# Patient Record
Sex: Female | Born: 1975 | Race: Black or African American | Hispanic: No | Marital: Single | Smoking: Current every day smoker
Health system: Southern US, Community
[De-identification: ages and names within clinical notes are randomized; demographics above are authoritative.]

## PROBLEM LIST (undated history)

## (undated) DIAGNOSIS — G039 Meningitis, unspecified: Secondary | ICD-10-CM

## (undated) DIAGNOSIS — J45909 Unspecified asthma, uncomplicated: Secondary | ICD-10-CM

## (undated) DIAGNOSIS — E739 Lactose intolerance, unspecified: Secondary | ICD-10-CM

## (undated) HISTORY — PX: TUBAL LIGATION: SHX77

---

## 2003-12-25 ENCOUNTER — Emergency Department: Payer: Self-pay | Admitting: Unknown Physician Specialty

## 2004-03-15 ENCOUNTER — Emergency Department: Payer: Self-pay | Admitting: Emergency Medicine

## 2004-07-13 ENCOUNTER — Emergency Department: Payer: Self-pay | Admitting: Emergency Medicine

## 2004-08-23 ENCOUNTER — Emergency Department: Payer: Self-pay | Admitting: Emergency Medicine

## 2005-02-06 ENCOUNTER — Emergency Department: Payer: Self-pay | Admitting: Emergency Medicine

## 2006-05-19 ENCOUNTER — Emergency Department: Payer: Self-pay | Admitting: Emergency Medicine

## 2006-09-27 ENCOUNTER — Emergency Department: Payer: Self-pay | Admitting: Internal Medicine

## 2007-06-30 ENCOUNTER — Emergency Department: Payer: Self-pay | Admitting: Emergency Medicine

## 2007-09-19 ENCOUNTER — Emergency Department: Payer: Self-pay

## 2007-11-09 ENCOUNTER — Emergency Department: Payer: Self-pay | Admitting: Emergency Medicine

## 2008-02-13 ENCOUNTER — Emergency Department: Payer: Self-pay | Admitting: Emergency Medicine

## 2008-02-19 ENCOUNTER — Emergency Department: Payer: Self-pay | Admitting: Emergency Medicine

## 2008-11-28 ENCOUNTER — Emergency Department: Payer: Self-pay | Admitting: Emergency Medicine

## 2010-11-21 ENCOUNTER — Emergency Department: Payer: Self-pay | Admitting: Emergency Medicine

## 2011-08-06 ENCOUNTER — Emergency Department: Payer: Self-pay | Admitting: Emergency Medicine

## 2011-08-06 LAB — CBC
HCT: 40 % (ref 35.0–47.0)
HGB: 12.7 g/dL (ref 12.0–16.0)
MCH: 27 pg (ref 26.0–34.0)
WBC: 12.1 10*3/uL — ABNORMAL HIGH (ref 3.6–11.0)

## 2011-08-06 LAB — URINALYSIS, COMPLETE
Bacteria: NONE SEEN
Specific Gravity: 1.032 (ref 1.003–1.030)
Squamous Epithelial: 23

## 2011-08-06 LAB — PREGNANCY, URINE: Pregnancy Test, Urine: NEGATIVE m[IU]/mL

## 2012-07-13 ENCOUNTER — Emergency Department: Payer: Self-pay | Admitting: Emergency Medicine

## 2012-09-07 ENCOUNTER — Emergency Department: Payer: Self-pay | Admitting: Emergency Medicine

## 2013-01-16 ENCOUNTER — Encounter (HOSPITAL_COMMUNITY): Payer: Self-pay | Admitting: Emergency Medicine

## 2013-01-16 ENCOUNTER — Emergency Department (HOSPITAL_COMMUNITY)
Admission: EM | Admit: 2013-01-16 | Discharge: 2013-01-16 | Disposition: A | Discharge status: Home / Self Care | Payer: Self-pay | Attending: Emergency Medicine | Admitting: Emergency Medicine

## 2013-01-16 DIAGNOSIS — R52 Pain, unspecified: Secondary | ICD-10-CM | POA: Insufficient documentation

## 2013-01-16 DIAGNOSIS — F17200 Nicotine dependence, unspecified, uncomplicated: Secondary | ICD-10-CM | POA: Insufficient documentation

## 2013-01-16 DIAGNOSIS — Z791 Long term (current) use of non-steroidal anti-inflammatories (NSAID): Secondary | ICD-10-CM | POA: Insufficient documentation

## 2013-01-16 DIAGNOSIS — R220 Localized swelling, mass and lump, head: Secondary | ICD-10-CM | POA: Insufficient documentation

## 2013-01-16 DIAGNOSIS — K089 Disorder of teeth and supporting structures, unspecified: Secondary | ICD-10-CM | POA: Insufficient documentation

## 2013-01-16 DIAGNOSIS — H9202 Otalgia, left ear: Secondary | ICD-10-CM

## 2013-01-16 DIAGNOSIS — H9209 Otalgia, unspecified ear: Secondary | ICD-10-CM | POA: Insufficient documentation

## 2013-01-16 DIAGNOSIS — J45909 Unspecified asthma, uncomplicated: Secondary | ICD-10-CM | POA: Insufficient documentation

## 2013-01-16 HISTORY — DX: Unspecified asthma, uncomplicated: J45.909

## 2013-01-16 HISTORY — DX: Meningitis, unspecified: G03.9

## 2013-01-16 MED ORDER — HYDROCODONE-ACETAMINOPHEN 5-325 MG PO TABS: 1.0000 | ORAL_TABLET | ORAL | Status: DC | PRN

## 2013-01-16 MED ORDER — IBUPROFEN 400 MG PO TABS
800.0000 mg | ORAL_TABLET | Freq: Once | ORAL | Status: AC
  Administered 2013-01-16: 800 mg via ORAL
  Filled 2013-01-16: qty 2

## 2013-01-16 MED ORDER — CIPROFLOXACIN HCL 500 MG PO TABS: 500.0000 mg | ORAL_TABLET | Freq: Two times a day (BID) | ORAL | Status: DC

## 2013-01-16 MED ORDER — IBUPROFEN 800 MG PO TABS: 800.0000 mg | ORAL_TABLET | Freq: Three times a day (TID) | ORAL | Status: DC

## 2013-01-16 NOTE — ED Provider Notes (Signed)
CSN: [id number]     Arrival date & time 01/16/13  1458 History  This chart was scribed for Felicie Morn, NP, working with Celene Kras, MD, by Ardelia Mems ED Scribe. This patient was seen in room TR08C/TR08C and the patient's care was started at 3:50 PM.   Chief Complaint  Patient presents with  . Otalgia  . Facial Swelling    Patient is a 37 y.o. female presenting with ear pain. The history is provided by the patient. No language interpreter was used.  Otalgia Location:  Left Quality:  Throbbing Severity:  Moderate Onset quality:  Sudden Duration:  3 days Timing:  Intermittent Progression:  Unchanged Chronicity:  New Context comment:  Reports feeling her ear "pop", causing the onset of this pain Relieved by:  None tried Worsened by:  Nothing tried Ineffective treatments:  None tried Associated symptoms: no fever   Associated symptoms comment:  Facial pain. Facial swelling. Dental pain.   HPI Comments: Emma [name] is a 37 y.o. female who presents to the Emergency Department complaining of intermittent, moderate "throbbing" left ear pain onset 3 days ago. She states that she felt her left ear "pop" 3 days ago, causing the onset of her pain. She also reports associated left-sided facial swelling, "throbbing" left-sided facial pain and "throbbing" upper left dental pain onset 3 days ago, after her ear "popped". She states that she has not been able to eat over the past 3 days due to her pain. She states that she has no prior history of dental pain. She states that she has no PCP and no insurance. She denies trouble swallowing or any other pain or symptoms. She states that her only medication allergy is Zithromax.   Past Medical History  Diagnosis Date  . Asthma   . Meningitis    Past Surgical History  Procedure Laterality Date  . Tubal ligation     No family history on file. History  Substance Use Topics  . Smoking status: Current Every Day Smoker  . Smokeless tobacco:  Not on file  . Alcohol Use: Yes     Comment: occ   OB History   Grav Para Term Preterm Abortions TAB SAB Ect Mult Living                 Review of Systems  Constitutional: Negative for fever.  HENT: Positive for ear pain (left) and facial swelling (left-sided). Negative for trouble swallowing.        Left-sided facial pain  All other systems reviewed and are negative.   Allergies  Zithromax  Home Medications   Current Outpatient Rx  Name  Route  Sig  Dispense  Refill  . ciprofloxacin (CIPRO) 500 MG tablet   Oral   Take 1 tablet (500 mg total) by mouth every 12 (twelve) hours.   10 tablet   0   . HYDROcodone-acetaminophen (NORCO/VICODIN) 5-325 MG per tablet   Oral   Take 1 tablet by mouth every 4 (four) hours as needed for severe pain.   10 tablet   0   . ibuprofen (ADVIL,MOTRIN) 800 MG tablet   Oral   Take 1 tablet (800 mg total) by mouth 3 (three) times daily.   21 tablet   0     Triage Vitals: BP 138/82  Pulse 105  Temp(Src) 98.9 F (37.2 C) (Oral)  Resp 22  SpO2 95%  LMP 01/04/2013  Physical Exam  Nursing note and vitals reviewed. Constitutional: She is oriented to person,  place, and time. She appears well-developed and well-nourished. No distress.  HENT:  Head: Normocephalic and atraumatic.  Edematous outer ear canal on the left. TM appears to be intact.   Eyes: EOM are normal.  Neck: Neck supple. No tracheal deviation present.  Cardiovascular: Normal rate.   Pulmonary/Chest: Effort normal. No respiratory distress.  Musculoskeletal: Normal range of motion.  Neurological: She is alert and oriented to person, place, and time.  Skin: Skin is warm and dry.  Psychiatric: She has a normal mood and affect. Her behavior is normal.    ED Course  Procedures (including critical care time)  DIAGNOSTIC STUDIES: Oxygen Saturation is 95% on RA, adequate by my interpretation.    COORDINATION OF CARE: 3:54 PM- Will order Motrin. Will discharge with Cipro,  Motrin and Norco. Pt advised of plan for treatment and pt agrees.  Labs Review Labs Reviewed - No data to display Imaging Review No results found.  EKG Interpretation   None       MDM   1. Acute otalgia, left       I personally performed the services described in this documentation, which was scribed in my presence. The recorded information has been reviewed and is accurate.    Jimmye Norman, NP 01/16/13 [phone]

## 2013-01-16 NOTE — ED Notes (Signed)
Pt st's she felt a pop in left ear 2 days ago.  Now has pain to left ear and left side of face with swelling.  Pt also c/o pain to left upper gum.

## 2013-01-16 NOTE — ED Provider Notes (Signed)
Medical screening examination/treatment/procedure(s) were performed by non-physician practitioner and as supervising physician I was immediately available for consultation/collaboration.    Celene Kras, MD 01/16/13 [phone]

## 2013-01-16 NOTE — ED Notes (Signed)
Pt states she felt something pop inside left ear and reports tooth pain.  Pt has facial swelling to left side of face.

## 2013-01-18 ENCOUNTER — Inpatient Hospital Stay: Payer: Self-pay | Admitting: Internal Medicine

## 2013-01-18 LAB — COMPREHENSIVE METABOLIC PANEL
Alkaline Phosphatase: 84 U/L
Anion Gap: 3 — ABNORMAL LOW (ref 7–16)
BUN: 6 mg/dL — ABNORMAL LOW (ref 7–18)
Bilirubin,Total: 0.9 mg/dL (ref 0.2–1.0)
Calcium, Total: 9.2 mg/dL (ref 8.5–10.1)
Chloride: 104 mmol/L (ref 98–107)
Creatinine: 0.77 mg/dL (ref 0.60–1.30)
EGFR (African American): >60
Osmolality: 269 (ref 275–301)
Potassium: 3.5 mmol/L (ref 3.5–5.1)
SGOT(AST): 39 U/L — ABNORMAL HIGH (ref 15–37)
Sodium: 136 mmol/L (ref 136–145)

## 2013-01-18 LAB — CBC WITH DIFFERENTIAL/PLATELET
Basophil #: 0 10*3/uL (ref 0.0–0.1)
Basophil %: 0.6 %
HCT: 39.9 % (ref 35.0–47.0)
MCHC: 33.2 g/dL (ref 32.0–36.0)
Neutrophil #: 5.9 10*3/uL (ref 1.4–6.5)
Neutrophil %: 69.5 %
Platelet: 317 10*3/uL (ref 150–440)
RBC: 4.6 10*6/uL (ref 3.80–5.20)
WBC: 8.5 10*3/uL (ref 3.6–11.0)

## 2013-01-19 LAB — DRUG SCREEN, URINE
Cannabinoid 50 Ng, Ur ~~LOC~~: NEGATIVE (ref ?–50)
Cocaine Metabolite,Ur ~~LOC~~: POSITIVE (ref ?–300)
MDMA (Ecstasy)Ur Screen: NEGATIVE (ref ?–500)
Methadone, Ur Screen: NEGATIVE (ref ?–300)
Opiate, Ur Screen: POSITIVE (ref ?–300)

## 2013-01-19 LAB — CBC WITH DIFFERENTIAL/PLATELET
Basophil %: 0.4 %
HGB: 11.7 g/dL — ABNORMAL LOW (ref 12.0–16.0)
Lymphocyte #: 2.1 10*3/uL (ref 1.0–3.6)
MCH: 28.8 pg (ref 26.0–34.0)
MCHC: 33.3 g/dL (ref 32.0–36.0)
MCV: 86 fL (ref 80–100)
Monocyte #: 0.7 x10 3/mm (ref 0.2–0.9)
Monocyte %: 9.9 %
WBC: 7.2 10*3/uL (ref 3.6–11.0)

## 2013-01-19 LAB — BASIC METABOLIC PANEL
Creatinine: 0.73 mg/dL (ref 0.60–1.30)
EGFR (African American): >60
Osmolality: 269 (ref 275–301)
Potassium: 3.2 mmol/L — ABNORMAL LOW (ref 3.5–5.1)

## 2013-01-20 LAB — POTASSIUM: Potassium: 3.6 mmol/L (ref 3.5–5.1)

## 2013-01-23 LAB — CULTURE, BLOOD (SINGLE)

## 2013-01-26 LAB — WOUND CULTURE

## 2013-10-11 ENCOUNTER — Encounter (HOSPITAL_COMMUNITY): Payer: Self-pay | Admitting: Emergency Medicine

## 2013-10-11 ENCOUNTER — Emergency Department (HOSPITAL_COMMUNITY)
Admission: EM | Admit: 2013-10-11 | Discharge: 2013-10-11 | Disposition: A | Discharge status: Home / Self Care | Payer: Self-pay | Attending: Emergency Medicine | Admitting: Emergency Medicine

## 2013-10-11 DIAGNOSIS — J45909 Unspecified asthma, uncomplicated: Secondary | ICD-10-CM | POA: Insufficient documentation

## 2013-10-11 DIAGNOSIS — Z880 Allergy status to penicillin: Secondary | ICD-10-CM | POA: Insufficient documentation

## 2013-10-11 DIAGNOSIS — Z8639 Personal history of other endocrine, nutritional and metabolic disease: Secondary | ICD-10-CM | POA: Insufficient documentation

## 2013-10-11 DIAGNOSIS — Z791 Long term (current) use of non-steroidal anti-inflammatories (NSAID): Secondary | ICD-10-CM | POA: Insufficient documentation

## 2013-10-11 DIAGNOSIS — F17200 Nicotine dependence, unspecified, uncomplicated: Secondary | ICD-10-CM | POA: Insufficient documentation

## 2013-10-11 DIAGNOSIS — Z79899 Other long term (current) drug therapy: Secondary | ICD-10-CM | POA: Insufficient documentation

## 2013-10-11 DIAGNOSIS — Z8669 Personal history of other diseases of the nervous system and sense organs: Secondary | ICD-10-CM | POA: Insufficient documentation

## 2013-10-11 DIAGNOSIS — R509 Fever, unspecified: Secondary | ICD-10-CM | POA: Insufficient documentation

## 2013-10-11 DIAGNOSIS — R220 Localized swelling, mass and lump, head: Secondary | ICD-10-CM | POA: Insufficient documentation

## 2013-10-11 DIAGNOSIS — K0889 Other specified disorders of teeth and supporting structures: Secondary | ICD-10-CM

## 2013-10-11 DIAGNOSIS — R221 Localized swelling, mass and lump, neck: Secondary | ICD-10-CM

## 2013-10-11 DIAGNOSIS — Z862 Personal history of diseases of the blood and blood-forming organs and certain disorders involving the immune mechanism: Secondary | ICD-10-CM | POA: Insufficient documentation

## 2013-10-11 DIAGNOSIS — K089 Disorder of teeth and supporting structures, unspecified: Secondary | ICD-10-CM | POA: Insufficient documentation

## 2013-10-11 HISTORY — DX: Lactose intolerance, unspecified: E73.9

## 2013-10-11 MED ORDER — CLINDAMYCIN HCL 150 MG PO CAPS: 150.0000 mg | ORAL_CAPSULE | Freq: Four times a day (QID) | ORAL | Status: DC

## 2013-10-11 MED ORDER — IBUPROFEN 400 MG PO TABS
600.0000 mg | ORAL_TABLET | Freq: Once | ORAL | Status: AC
  Administered 2013-10-11: 600 mg via ORAL
  Filled 2013-10-11: qty 2

## 2013-10-11 MED ORDER — IBUPROFEN 600 MG PO TABS: 600.0000 mg | ORAL_TABLET | Freq: Four times a day (QID) | ORAL | Status: DC | PRN

## 2013-10-11 MED ORDER — OXYCODONE-ACETAMINOPHEN 5-325 MG PO TABS: 1.0000 | ORAL_TABLET | ORAL | Status: DC | PRN

## 2013-10-11 NOTE — ED Notes (Addendum)
Pt reports left sided facial swelling,left ear pain x2 days. Pt reports left ear ringing and "stabbing pain" in ear. Pt denies any known injury. nad noted. Airway patent.

## 2013-10-11 NOTE — ED Notes (Signed)
MD at bedside. 

## 2013-10-11 NOTE — Discharge Instructions (Signed)
Dental Pain °A tooth ache may be caused by cavities (tooth decay). Cavities expose the nerve of the tooth to air and hot or cold temperatures. It may come from an infection or abscess (also called a boil or furuncle) around your tooth. It is also often caused by dental caries (tooth decay). This causes the pain you are having. °DIAGNOSIS  °Your caregiver can diagnose this problem by exam. °TREATMENT  °· If caused by an infection, it may be treated with medications which kill germs (antibiotics) and pain medications as prescribed by your caregiver. Take medications as directed. °· Only take over-the-counter or prescription medicines for pain, discomfort, or fever as directed by your caregiver. °· Whether the tooth ache today is caused by infection or dental disease, you should see your dentist as soon as possible for further care. °SEEK MEDICAL CARE IF: °The exam and treatment you received today has been provided on an emergency basis only. This is not a substitute for complete medical or dental care. If your problem worsens or new problems (symptoms) appear, and you are unable to meet with your dentist, call or return to this location. °SEEK IMMEDIATE MEDICAL CARE IF:  °· You have a fever. °· You develop redness and swelling of your face, jaw, or neck. °· You are unable to open your mouth. °· You have severe pain uncontrolled by pain medicine. °MAKE SURE YOU:  °· Understand these instructions. °· Will watch your condition. °· Will get help right away if you are not doing well or get worse. °Document Released: 01/11/2005 Document Revised: 04/05/2011 Document Reviewed: 08/30/2007 °ExitCare® Patient Information ©2015 [employer]. This information is not intended to replace advice given to you by your health care provider. Make sure you discuss any questions you have with your health care provider. ° °Emergency Department Resource Guide °1) Find a Doctor and Pay Out of Pocket °Although you won't have to find out who  is covered by your insurance plan, it is a good idea to ask around and get recommendations. You will then need to call the office and see if the doctor you have chosen will accept you as a new patient and what types of options they offer for patients who are self-pay. Some doctors offer discounts or will set up payment plans for their patients who do not have insurance, but you will need to ask so you aren't surprised when you get to your appointment. ° °2) Contact Your Local Health Department °Not all health departments have doctors that can see patients for sick visits, but many do, so it is worth a call to see if yours does. If you don't know where your local health department is, you can check in your phone book. The CDC also has a tool to help you locate your state's health department, and many state websites also have listings of all of their local health departments. ° °3) Find a Walk-in Clinic °If your illness is not likely to be very severe or complicated, you may want to try a walk in clinic. These are popping up all over the country in pharmacies, drugstores, and shopping centers. They're usually staffed by nurse practitioners or physician assistants that have been trained to treat common illnesses and complaints. They're usually fairly quick and inexpensive. However, if you have serious medical issues or chronic medical problems, these are probably not your best option. ° °No Primary Care Doctor: °- Call [facility] at  832-8000 - they can help you locate a primary   care doctor that  accepts your insurance, provides certain services, etc. °- Physician Referral Service- 1-800-533-3463 ° °Chronic Pain Problems: °Organization         Address  Phone   Notes  °[facility]  (336) 297-2271 Patients need to be referred by their primary care doctor.  ° °Medication Assistance: °Organization         Address  Phone   Notes  °[place] [facility] [address]  [address] °[place], [place] [address] (336) 641-8030 --Must be a resident of [place] °-- Must have NO insurance coverage whatsoever (no Medicaid/ Medicare, etc.) °-- The pt. MUST have a primary care doctor that directs their care regularly and follows them in the community °  °[facility]  (866) 331-1348   °[employer]  (888) 892-1162   ° °Agencies that provide inexpensive medical care: °Organization         Address  Phone   Notes  °[facility]  (336) 832-8035   °[facility]    (336) 832-7272   °[facility] [address] °[place], [place] [address] (336) 832-4777   °[facility] [address], °[place] (336) 271-4999   °[facility]    (336) 373-0678   °[facility]    (336) 272-1050   °[facility] ° [address], [place] Phone:  (336) 832-4444, Fax:  (336) 832-4440 Hours of Operation:  9 am - 6 pm, M-F.  Also accepts Medicaid/Medicare and self-pay.  °[facility] ° [address], [place] Phone: (336) 832-3150, Fax: (336) 832-3151. Hours of Operation:  8:30 am - 5:30 pm, M-F.  Also accepts Medicaid and self-pay.  °[facility] [place] [address], [place] Phone: (336) 878-6027   °[facility] [address], [place], [place] (336)723-1848, Ext. 123 Mondays & Thursdays: 7-9 AM.  First 15 patients are seen on a first come, first serve basis. °  ° °Medicaid-accepting [place] Providers: ° °Organization         Address  Phone   Notes  °[facility] [address], [place] (336) 641-2100 Also accepts self-pay patients.  °[facility] [address], [place] ° (336) 856-9996   °[facility] [address], [place] (336) 288-8857   °[facility] [address] [place] [address], [place] (336) 299-7000   °Veita Bland [address], [place]  ° (336) 373-1557 Only accepts [place] Access Medicaid patients after they have their name applied to their card.  ° °Self-Pay (no insurance) in [place]: ° °Organization         Address  Phone   Notes  °Sickle Cell Patients, [facility] [address], [place] (336) 832-1970   °[facility] [address], [place] (336) 832-4400   °[facility] ° [address], [place] (336) 992-4800   °[facility]/Dr. Osei-Bonsu ° [address] [place] [address], [place] or [address], [place] (336) 841-8500 Phone number for both [place] and [place] locations is the same.  °[facility] [address],  (336) 299-0000   °[facility]  [address] [place] [address],  or [address] (336) 852-7530 °(336) 878-2260   °  [facility] [address], [place] (336) 350-1642, phone; (336) 294-5005, fax Sees patients 1st and 3rd Saturday of every month.  Must not qualify for public or private insurance (i.e. Medicaid, Medicare, [place] Health Choice, Veterans' Benefits) • Household income should be no more than 200% of the poverty level •The clinic cannot treat you if you are pregnant or think you are pregnant • Sexually transmitted diseases are not treated at the clinic.  ° ° °Dental Care: °Organization         Address  Phone  Notes  °[facility] [address], [place] (336) 641-6152 Accepts children up to age 21 who are enrolled in Medicaid or [place] Health Choice; pregnant women with a Medicaid card; and children who have applied for Medicaid or [place] Health Choice, but were declined, whose parents can pay a reduced fee at time of service.  °[facility] [place]  [address], [place] (336) 641-7733 Accepts children up to age 21 who are  enrolled in Medicaid or [place] Health Choice; pregnant women with a Medicaid card; and children who have applied for Medicaid or [place] Health Choice, but were declined, whose parents can pay a reduced fee at time of service.  °[facility] ° [address], [place] (336) 641-4533 Patients are seen by appointment only. Walk-ins are not accepted. [facility] will see patients 18 years of age and older. °Monday - Tuesday (8am-5pm) °Most Wednesdays (8:30-5pm) °$30 per visit, cash only  °[facility] ° [address], [place] (336) 641-4533 Patients are seen by appointment only. Walk-ins are not accepted. [facility] will see patients 18 years of age and older. °One Wednesday Evening (Monthly: Volunteer Based).  $30 per visit, cash only  °[facility]  (919) 537-3737 for adults; Children under age 4, call Graduate Pediatric Dentistry at (919) [phone]. Children aged 4-14, please call (919) [phone] to request a pediatric application. ° Dental services are provided in all areas of dental care including fillings, crowns and bridges, complete and partial dentures, implants, gum treatment, root canals, and extractions. Preventive care is also provided. Treatment is provided to both adults and children. °Patients are selected via a lottery and there is often a waiting list. °  °[facility] [address], °[place] ° (336) 763-8833 [email or web address] °  °[facility] [address], [place], [place] (336)723-1848, Ext. 123 Second and Fourth Thursday of each month, opens at 6:30 AM; Clinic ends at 9 AM.  Patients are seen on a first-come first-served basis, and a limited number are seen during each clinic.  ° °[facility] ° [address], [place],  (336) 723-7904   Eligibility Requirements °You must have lived in [place], [place], or [place] counties for at least the last three months. °  You  cannot be eligible for state or federal sponsored healthcare insurance, including Veterans Administration, Medicaid, or Medicare. °  You generally cannot be eligible for healthcare insurance through your employer.  °  How to apply: °Eligibility screenings are held every Tuesday and Wednesday afternoon from 1:00 pm until 4:00 pm. You do not need an appointment for the interview!  °[facility] [address], [place],  336-631-2330   °[facility]  336-342-8273   °[facility]  336-703-3100   °[facility]  336-570-6415   ° °Behavioral Health Resources in the Community: °Intensive Outpatient Programs °Organization         Address  Phone  Notes  °[facility] [address], [place], [place] 336-878-6098   °[facility] [address], [place], [place] 336-832-9800   °[facility] [address], [place], [place] ° 336-882-2125   °[facility] [address],  °[place], [place] 1-800-853-5163 or 336-641-4981   °Substance Abuse Resources °Organization         Address  Phone  Notes  °[facility]  336-882-2125   °[facility]  336-784-9470   °[facility]  336-285-9073   °[facility]  336-845-3988   °Residential & Outpatient Substance Abuse Program  1-800-659-3381   °Psychological Services °Organization         Address  Phone  Notes  °[facility]  336- 832-9600   °[facility]  336- 378-7881   °[facility] [address], [place] 1-800-853-5163 or 336-641-4981   ° °Mobile Crisis Teams °Organization         Address  Phone  Notes  °[facility], Mobile Crisis Care Unit  1-877-626-1772   °Assertive °[facility] ° [address] [place], [place] 336-834-9664   °Sharon DeEsch [address] °[place] 336-554-5454   ° °Self-Help/Support  Groups °Organization         Address  Phone             Notes  °[facility] - variety of support groups  336- 373-1402 Call for more information  °Narcotics Anonymous (NA), [facility] [address], °[place]  2 meetings at this location  ° °Residential Treatment Programs °Organization         Address  Phone  Notes  °[facility] [address],    °[place]  1-866-801-8205   °[facility] ° [address], [place], [place] 704-293-8524   °[facility] [address], [place] 336-845-3988 Admissions: 8am-3pm M-F  °[facility] [address]    °[place], [place] 336-841-1104   °[facility] [address], [place], [place] 336-379-7146   °[facility] [address]  °[place], [place] 336-285-9073   °[facility] Intensive Outpatient [address], [place], [place] 336-852-3033   °[facility] [address]  °[place], [place] 1-877-615-2722 or 336-784-9470   °[facility] ([facility] [address] [place], [place] 336-227-7417 Accepts Medicaid  °[facility] [address]  ° [place] 1-800-659-3381 Substance Abuse/Addiction Treatment  ° °[place] Behavioral Health Resources °Organization         Address  Phone  Notes  °[facility]  (888) 581-9988   °Julie Brannon, PhD [address] [place], [place]   (336) 349-5553 or (336) 951-0000   °[facility]   [address] °[place], [place] (336) 349-4454   °[facility] [address], [place], [place] (336) 342-8316 Insurance/Medicaid/sponsorship through [facility]  °[facility] [address]                                    [place],  (336) 342-8316 Therapy/tele-psych/case  °[facility]   [address]  ° [place], [place] (336) 349-2233    °Dr. Arfeen  (336) 349-4544   °[facility] [place]  [employer] [place] [facility] 1) [address], [place] °2) [address], [place] °3)  [address], [place] (336) 349-3220 °(336) 342-7768 ° °(336) 342-8140   °[place] [facility] (336) 342-1394 or (336) 342-3537 (After Hours)    ° ° ° °

## 2013-10-12 ENCOUNTER — Telehealth (HOSPITAL_COMMUNITY): Payer: Self-pay | Admitting: *Deleted

## 2013-10-12 NOTE — ED Notes (Signed)
Received call from [facility], [facility] stating that pt is at facility claiming to have left prescriptions to be filled on Thursday 10/11/13 however the pharmacy can find no record of requested prescriptions: Cleocin, Oxycodone, and Ibuprofen.  Pt was insistent that precriptions were dropped off one day prior.  Camera recordings do not show anyone resembling patient leaving any prescriptions at this [facility].  Agreed with pharmacy that prescripton for Cleocin can be filled but cannot authorize prescriptions for Oxycodone and Ibuprofen.  Pt left when only able to receive antibiotic prescription.

## 2013-10-19 NOTE — ED Provider Notes (Signed)
CSN: [id number]     Arrival date & time 10/11/13  1244 History   First MD Initiated Contact with Patient 10/11/13 1252     Chief Complaint  Patient presents with  . Facial Swelling     (Consider location/radiation/quality/duration/timing/severity/associated sxs/prior Treatment) HPI  38 year old female with left facial swelling and left ear pain. Symptom onset about 2-3 days ago. Denies any trauma. Progressively worsening. Constant pain. Sometimes worse with swallowing, although not consistently. Occasional sharper pain in her left ear. Feels like she is being stabbed. No drainage. No change in hearing. Subjective fever. No neck pain or neck stiffness. No difficulty swallowing. No respiratory complaints.  Past Medical History  Diagnosis Date  . Asthma   . Meningitis   . Lactose intolerance    Past Surgical History  Procedure Laterality Date  . Tubal ligation     History reviewed. No pertinent family history. History  Substance Use Topics  . Smoking status: Current Every Day Smoker  . Smokeless tobacco: Not on file  . Alcohol Use: Yes     Comment: occ   OB History   Grav Para Term Preterm Abortions TAB SAB Ect Mult Living                 Review of Systems  All systems reviewed and negative, other than as noted in HPI.   Allergies  Penicillins and Zithromax  Home Medications   Prior to Admission medications   Medication Sig Start Date End Date Taking? Authorizing Provider  amoxicillin (AMOXIL) 500 MG capsule Take 500 mg by mouth once as needed (for possible infection/ear pain).    Historical Provider, MD  ciprofloxacin (CIPRO) 500 MG tablet Take 1 tablet (500 mg total) by mouth every 12 (twelve) hours. 01/16/13   Jimmye Norman, NP  clindamycin (CLEOCIN) 150 MG capsule Take 1 capsule (150 mg total) by mouth 4 (four) times daily. 10/11/13   Raeford Razor, MD  HYDROcodone-acetaminophen (NORCO/VICODIN) 5-325 MG per tablet Take 1 tablet by mouth every 4 (four) hours as  needed for severe pain. 01/16/13   Jimmye Norman, NP  ibuprofen (ADVIL,MOTRIN) 600 MG tablet Take 1 tablet (600 mg total) by mouth every 6 (six) hours as needed. 10/11/13   Raeford Razor, MD  ibuprofen (ADVIL,MOTRIN) 800 MG tablet Take 1 tablet (800 mg total) by mouth 3 (three) times daily. 01/16/13   Jimmye Norman, NP  ibuprofen (ADVIL,MOTRIN) 800 MG tablet Take 800 mg by mouth once as needed for moderate pain.    Historical Provider, MD  oxyCODONE-acetaminophen (PERCOCET/ROXICET) 5-325 MG per tablet Take 1-2 tablets by mouth every 4 (four) hours as needed for moderate pain or severe pain. 10/11/13   Raeford Razor, MD   BP 135/89  Pulse 85  Temp(Src) 99.1 F (37.3 C) (Oral)  Resp 18  Ht  (1.676 m)  Wt 225 lb (102.059 kg)  BMI 36.33 kg/m2  SpO2 98%  LMP 09/16/2013 Physical Exam  Nursing note and vitals reviewed. Constitutional: She appears well-developed and well-nourished. No distress.  HENT:  Head: Normocephalic and atraumatic.  Right Ear: External ear normal.  Left Ear: External ear normal.  I cannot appreciate any significant facial swelling. Patient does have tenderness over the left maxilla/zygoma though. Left upper premolar tender to percussion. No drainable collection. Posterior pharynx is clear. Handling secretions. Normal sounding phonation No stridor. Neck is supple. No adenopathy. Tympanic membranes are clear bilaterally.  Eyes: Conjunctivae are normal. Right eye exhibits no discharge. Left eye exhibits no  discharge.  Neck: Neck supple.  Cardiovascular: Normal rate, regular rhythm and normal heart sounds.  Exam reveals no gallop and no friction rub.   No murmur heard. Pulmonary/Chest: Effort normal and breath sounds normal. No respiratory distress.  Abdominal: Soft. She exhibits no distension. There is no tenderness.  Musculoskeletal: She exhibits no edema and no tenderness.  Neurological: She is alert.  Skin: Skin is warm and dry.  Psychiatric: She has a normal  mood and affect. Her behavior is normal. Thought content normal.    ED Course  Procedures (including critical care time) Labs Review Labs Reviewed - No data to display  Imaging Review No results found.   EKG Interpretation None      MDM   Final diagnoses:  Pain, dental    60 she'll female with facial pain. Likely dental in origin. No evidence of deep space neck infection. Nontoxic. Suspect that left ear pain is referred. Antibiotics and pain medicine. Return precautions were discussed.   Raeford Razor, MD 10/19/13 [phone]

## 2014-05-17 NOTE — Consult Note (Signed)
PATIENT NAME:  Emma Emma [name], Emma Emma [name] MR#:  [id number] DATE OF BIRTH:  07-17-1975  DATE OF CONSULTATION:  01/19/2013  CONSULTING PHYSICIAN:  Emma BarefootJ. Madison Aijah Lattner, MD  REFERRING PHYSICIAN: Dr. Mordecai Emma [name].   HISTORY OF PRESENT ILLNESS: The patient is Emma [name] who was admitted yesterday with left facial pain and facial cellulitis. This had been present for about 3 days and the pain and swelling has gradually increased. She has over the past 24 hours begun to experience difficulty breathing through the nose and her eyes beginning to swell. She failed outpatient antibiotics (ciprofloxacin). She has not seen Emma [name] dentist in well over Emma [name] year   ALLERGIES: AZITHROMYCIN.   MEDICATIONS:  Ibuprofen.   SOCIAL HISTORY: The patient smokes Emma [name] pack of cigarettes Emma [name] day, uses cocaine regularly.   PAST MEDICAL HISTORY:  1.  Obesity.  2.  Asthma.  3.  Multiple ear infections and sinus infections.   PHYSICAL EXAMINATION: GENERAL: Obese, African American female moderately anxious. HEAD AND FACE: Diffuse left facial edema, concentrated primarily at the left medial labial fold with periorbital erythema and edema.  ORAL CAVITY AND OROPHARYNX: There is exquisite tenderness at an erythematous bulge in the gingival labial sulcus overlying the left canine. No other intraoral masses or lesions.  NOSE: The intranasal mucosa is dry. There are no mucosal lesions or ulcerations. Flexible nasal endoscopy reveals no purulence at the ostiomeatal complexes. Nasopharynx is without masses or lesions, including the fossa of Rosenmuller.  EARS: External auditory canals are clear. There are no effusions or fluid in the middle ears  NECK: There is tenderness at the left level 2.   IMPRESSION: Left maxillary dental abscess. I discussed the pathophysiology of this with the patient. I have implored her to get in to see Emma [name] dentist. I have given her the options including continued IV antibiotics (which I do not think will  cause resolution of this), [name] that has [facility] surgery back-up, versus incision and drainage under general anesthesia (she insists on general anesthesia). The patient opts for the latter. I have discussed the risks including bleeding, worsening of infection, nonresolution of the infection and the need for additional dental work. The patient understands these risks and wishes to proceed. The nurse was present for this discussion and will obtain consent. The patient will remain n.p.o. until her surgery.   ____________________________ Emma CommonsJ. Gertie BaronMadison Aubryana Vittorio, MD [email or web address]:dp D: 01/19/2013 14:09:18 ET T: 01/19/2013 14:45:23 ET JOB#: [id number]  cc: Emma BarefootJ. Madison Daissy Yerian, MD, <Dictator> [facility] - Practice Administrator Wendee CoppJMADISON Ugonna Keirsey MD ELECTRONICALLY SIGNED 01/20/2013 18:54

## 2014-05-17 NOTE — Op Note (Signed)
PATIENT NAME:  Emma [name], Emma [name] MR#:  [id number] DATE OF BIRTH:  1975/06/22  DATE OF PROCEDURE:  01/18/2013  PREOPERATIVE DIAGNOSIS: Left facial abscess secondary to dental abscess.   POSTOPERATIVE DIAGNOSIS: Left facial abscess secondary to dental abscess.   PROCEDURE: Incision and drainage of facial abscess.   SURGEON: Gertie BaronMadison Nefertari Rebman, M.D.   DESCRIPTION OF PROCEDURE: The patient was identified in the holding area, brought back to the operating room and placed in the supine position on the operating room table. After IV sedation had been instilled, the patient was turned 90 degrees counterclockwise from anesthesia. The gingival labial sulcus was infiltrated with 1% lidocaine with epinephrine mixed with 0.5% bupivacaine mixed 1:200,000 with epinephrine. [name] #15 blade was used to open the abscess widely.  [name] culture swab was placed in the center of the abscess cavity. The cavity was opened widely and irrigated copiously with saline under constant suction to make sure the patient's airway was not compromised. The patient experienced immediate relief.  The oral cavity and oropharynx were suctioned completely clear. The patient was returned to anesthesia, allowed to emerge from anesthesia, taken to the recovery room in stable condition. There were no complications. Estimated blood loss 10 mL.   ____________________________ J. Gertie BaronMadison Dorlisa Savino, MD [email or web address] D: 01/19/2013 22:08:31 ET T: 01/20/2013 07:39:12 ET JOB#: [id number]  cc: Zackery BarefootJ. Madison Arlenis Blaydes, MD, <Dictator> [facility] - Practice Administrator Wendee CoppJMADISON Anmol Fleck MD ELECTRONICALLY SIGNED 01/20/2013 18:54

## 2014-05-17 NOTE — Discharge Summary (Signed)
PATIENT NAME:  Emma [name], Emma [name] MR#:  [id number] DATE OF BIRTH:  [birth year]  DATE OF ADMISSION:  01/18/2013 DATE OF DISCHARGE:  01/20/2013  PRIMARY CARE PHYSICIAN:  At the [facility].   FINAL DIAGNOSES: 1.  Facial cellulitis and maxillary abscess.  2.  Tobacco abuse.  3.  Cocaine use.  4.  Asthma.   MEDICATIONS ON DISCHARGE:  Include acetaminophen hydrocodone 325/5 mg 1 tablet every four hours as needed for pain.  Clindamycin 300 mg 1 capsule every six hours for 10 more days.   DIET:  Regular diet, regular consistency.   ACTIVITY:  As tolerated.   REFERRAL:   1.  [facility]  2.  Social worker to give information about drug rehab programs.  Primary care physician will be at the [facility].  Please follow up there within 2 to 4 weeks.  HOSPITAL COURSE:  The patient was admitted 01/18/2013 and discharged 01/20/2013.  The patient was admitted with facial cellulitis and facial pain, was initially started on IV Unasyn.  The patient was seen in consultation by Dr. Gertie BaronMadison Clark who took the patient to the Operating Room for incision and drainage of facial abscess.  Laboratory and radiological data during the hospital course included [name] glucose of 89, BUN 6, creatinine 0.77, sodium 136, potassium 3.5, chloride 104, CO2 29, calcium 9.2.  Liver function tests, AST slightly elevated at 39, total protein 8.4 on the liver function tests normal.  White blood cell count 8.5, hemoglobin and hematocrit 13.2 and 39.9, platelet count of 317.  Blood cultures negative.  CT scan of the maxillofacial area showed an area adjacent to the left maxilla.  It shows wall thickening concerning for abscess 2.8 x 1.9 x 1.2 cm.  Urine toxicology positive for cocaine and opiates.  Wound culture normal flora.  Repeat potassium 3.6.   HOSPITAL COURSE PER PROBLEM LIST:  1.  For the patient's facial cellulitis and maxillary abscess, probably secondary to dental abscess,  the patient was taken to OR and incision and drainage of maxillary abscess done.  The patient initially started on Unasyn and vancomycin was added.  The patient will be sent home on clindamycin which should cover tooth abscess and even cover MRSA.  Culture here was negative.  Dr. Chestine Sporelark did [facility]  The patient will call their clinic on Monday to set up an appointment.  2.  Tobacco abuse.  The patient must stop smoking.  3.  Cocaine use.  The patient interested in rehab for this.  I did have the social worker speak to her about potential programs.  4.  Asthma.  Respiratory status stable during the entire hospitalization.   Vital signs on discharge included [name] temperature of 98.1, pulse 74, respirations 18, blood pressure 103/67, pulse ox 96% on room air.  The patient's white count is in normal range.   Time spent on discharge 35 minutes.    ____________________________ Herschell Dimesichard J. Renae GlossWieting, MD rjw:ea D: 01/20/2013 15:52:45 ET T: 01/21/2013 01:46:43 ET JOB#: [id number]  cc: Herschell Dimesichard J. Renae GlossWieting, MD, <Dictator> [facility] JGertie Baron. Madison Clark, MD  Salley ScarletICHARD J Mio Schellinger MD ELECTRONICALLY SIGNED 01/22/2013 15:41

## 2014-05-17 NOTE — H&P (Signed)
PATIENT NAME:  Emma [name], Emma [name] MR#:  [id number] DATE OF BIRTH:  Jun 20, 1975  DATE OF ADMISSION:  01/18/2013  REASON FOR ADMISSION:  Facial cellulitis and facial pain.   PRIMARY CARE PHYSICIAN:  None.  REFERRING PHYSICIAN:  Dorothea Glassman, MD.   HISTORY OF PRESENT ILLNESS:  This is a very nice 39 year old female who has a history of being healthy overall with mild asthma, cocaine abuse, who comes with edema at the level of the face on the left side. The patient states that it has been going on for 3 days and the swelling has gradually increased. Everything started with a week of upper respiratory symptoms, nasal congestion with significant amount of thick secretions getting worse and worse to the point that she is having even trouble breathing through her nose. The patient states that for the past 24 to 48 hours, she has been having significant left facial pain and now her pain is located at the base of the upper molars. The patient has had a very low-grade fever and she is starting to get some relief after some pain medications have been given. Her pain intensity is 8/10. It is radiating from the base of the teeth into behind her eye. The patient had no nausea, vomiting, a low-grade fever, as mentioned above. The patient is admitted for treatment of this condition. She was put on ciprofloxacin before without any results.   REVIEW OF SYSTEMS:  CONSTITUTIONAL:  Positive low-grade fever. No fatigue. No weight loss or weight gain.  EYES:  No blurry vision, double vision or glaucoma. The patient is able to move her eye in all directions without significant pain inside the eye, but she had some irritation at the level of the conjunctivae. Positive erythema, positive eyelid swelling.  RESPIRATORY:  No cough. No wheezing. No hemoptysis. No COPD. The patient is a smoker. Smoking cessation counseling has been given with the patient for 4 minutes and the patient is saying that she will quit smoking.  ENT:  As  mentioned above. No tinnitus. No difficulty swallowing.  CARDIOVASCULAR:  No chest pain, orthopnea or syncope.  GASTROINTESTINAL:  No nausea, vomiting, abdominal pain, constipation or diarrhea.  GENITOURINARY:  No dysuria, hematuria or frequency changes.  ENDOCRINE:  No polyuria, polydipsia, polyphagia, cold or heat intolerance.  HEMATOLOGIC AND LYMPHATIC:  No anemia, easy bruising or bleeding.  SKIN:  No rashes or petechiae.  MUSCULOSKELETAL:  No significant neck pain, back pain or gout.  NEUROLOGIC:  No numbness, tingling, or new lesions.  PSYCHIATRIC:  No significant agitation or depression.   PAST MEDICAL HISTORY:  1.  Obesity.  2.  Asthma. 3.  Multiple ear and sinus infections.   ALLERGIES:  ZITHROMAX GIVE HER HIVES.   PAST SURGICAL HISTORY:  BTL.   FAMILY HISTORY:  Positive for colon cancer and lung cancer in her father, and MI in her father and mother.   SOCIAL HISTORY:  The patient smokes 1 pack of cigarettes a day for 7 years. She is ready to quit as mentioned above. Alcohol very occasionally, maybe 3 or 4 beers in a week. Cocaine use on a regular basis. The last time that she used cocaine, snorted, was a week ago. No IV drug abuse ever. She lives with her mother and her kids and she just recently lost her job the week before Thanksgiving.   MEDICATIONS:  Ibuprofen and ciprofloxacin.   PHYSICAL EXAMINATION:  VITAL SIGNS:  Blood pressure 137/74, pulse 81, respirations 16, temperature 98.3. Admission temperature 91.1.  Alert and oriented x 3, mild distress due to pain but hemodynamically stable.  HEENT:  Pupils are equal and reactive. Extraocular movements are intact. Mucosae are moist. Anicteric sclerae. Pink conjunctivae. No oral lesions. No oropharyngeal exudates. Her face has significant edema at the level of the left periorbitally areas. Her eyes shut, but she is able to open it up and close it on command. There is no significant involvement of the sclerae. There is some  erythema of the conjunctivae and preseptal swelling. There is tenderness to palpation of all the inflamed area. Her cheek on the left side at the level of the zygomatic arch is also erythematosus and elevated due to edema. The ear canal is clear but there is some erythema at the level of the tympanic membrane with significant amount of fluid behind the tympanic membrane on the left side.  NECK:  Supple. No JVD. No thyromegaly. No adenopathy. No carotid bruits.  CARDIOVASCULAR:  Regular rate and rhythm. No murmurs, rubs or gallops. No displacement of PMI.  LUNGS:  Clear without any wheezing or crepitus. No use of accessory muscles.  ABDOMEN:  Soft, nontender, nondistended. No hepatosplenomegaly. No masses. Bowel sounds are positive.  EXTREMITIES:  No edema, cyanosis or clubbing. Pulses +2. Capillary refill less than 3.  PSYCHIATRIC:  No anxiety or agitation. Alert, oriented x 3.  NEUROLOGIC:  Cranial nerves II through XII intact. The patient is able to move her left eye in all directions without any significant problems or pain.  MUSCULOSKELETAL:  No joint effusions or joint swelling.  LYMPHATIC:  Negative for lymphadenopathy in the neck or supraclavicular areas.  SKIN:  No rashes or petechiae.   LABORATORY, DIAGNOSTIC AND RADIOLOGICAL DATA:  Glucose is 89, BUN 6, creatinine 0.77. LFTs:  A slight elevation of AST at 39. White count is 8.5, hemoglobin is 13, platelet count 317.   CT scan:  As mentioned above, the patient has cellulitis with preseptal involvement and total obstruction at the level of the ostium on the left parasinus.    ASSESSMENT AND PLAN:  A 39 year old female with cellulitis, admitted for treatment of this condition.  1.  Preseptal/facial cellulitis. The patient is going to be treated with Unasyn to cover anaerobes and Gram-negatives of the upper respiratory airway. Also Gram-positives. The patient is going to be monitored closely. Blood cultures have been checked.  2.  Consider  the possibility of drainage of the sinuses while she is here by ENT and get cultures from there, if the patient does not have any significant improvement. 3.  We are going to ask for an ENT consultation for tomorrow morning.  4.  Offered the patient to have saline mist and other nasal sprays. The patient refuses due to the fact that every time that she is using, it hurts.   5.  Pain control to be achieved with Norco.  6.  Deep vein thrombosis prophylaxis with heparin. Gastrointestinal prophylaxis with Pepcid.  7.  For her asthma. The patient seems to be stable. No signs of exacerbation at this moment. Continue with albuterol MDI. 8.  Other medical problems are stable. 9.  Smoking cessation counseling given for 4 minutes. 10.  Cocaine abuse. Also the patient has been counseled.     TIME SPENT:  I spent about 45 minutes with this patient today.   ____________________________ Felipa Furnaceoberto Sanchez Gutierrez, MD rsg:jm D: 01/18/2013 14:06:49 ET T: 01/18/2013 15:00:13 ET JOB#: [id number]  cc: Felipa Furnaceoberto Sanchez Gutierrez, MD, <Dictator> Nickolaos Brallier Juanda ChanceSANCHEZ GUTIERRE MD ELECTRONICALLY SIGNED  01/22/2013 0:42 

## 2014-08-14 ENCOUNTER — Emergency Department
Admission: EM | Admit: 2014-08-14 | Discharge: 2014-08-14 | Disposition: A | Discharge status: Home / Self Care | Payer: Self-pay | Attending: Emergency Medicine | Admitting: Emergency Medicine

## 2014-08-14 ENCOUNTER — Encounter: Payer: Self-pay | Admitting: Emergency Medicine

## 2014-08-14 DIAGNOSIS — Z880 Allergy status to penicillin: Secondary | ICD-10-CM | POA: Insufficient documentation

## 2014-08-14 DIAGNOSIS — N12 Tubulo-interstitial nephritis, not specified as acute or chronic: Secondary | ICD-10-CM | POA: Insufficient documentation

## 2014-08-14 DIAGNOSIS — Z3202 Encounter for pregnancy test, result negative: Secondary | ICD-10-CM | POA: Insufficient documentation

## 2014-08-14 DIAGNOSIS — Z79899 Other long term (current) drug therapy: Secondary | ICD-10-CM | POA: Insufficient documentation

## 2014-08-14 DIAGNOSIS — Z792 Long term (current) use of antibiotics: Secondary | ICD-10-CM | POA: Insufficient documentation

## 2014-08-14 DIAGNOSIS — Z720 Tobacco use: Secondary | ICD-10-CM | POA: Insufficient documentation

## 2014-08-14 LAB — BASIC METABOLIC PANEL
ANION GAP: 8 (ref 5–15)
BUN: 5 mg/dL — ABNORMAL LOW (ref 6–20)
CO2: 27 mmol/L (ref 22–32)
Calcium: 8.9 mg/dL (ref 8.9–10.3)
Chloride: 103 mmol/L (ref 101–111)
Creatinine, Ser: 0.86 mg/dL (ref 0.44–1.00)
GFR calc Af Amer: >60 mL/min (ref >60)
GFR calc non Af Amer: >60 mL/min (ref >60)
Glucose, Bld: 104 mg/dL — ABNORMAL HIGH (ref 65–99)
Potassium: 3.4 mmol/L — ABNORMAL LOW (ref 3.5–5.1)
Sodium: 138 mmol/L (ref 135–145)

## 2014-08-14 LAB — URINALYSIS COMPLETE WITH MICROSCOPIC (ARMC ONLY)
BACTERIA UA: NONE SEEN
BILIRUBIN URINE: NEGATIVE
GLUCOSE, UA: NEGATIVE mg/dL
NITRITE: POSITIVE — AB
PROTEIN: 100 mg/dL — AB
SPECIFIC GRAVITY, URINE: 1.012 (ref 1.005–1.030)
pH: 7 (ref 5.0–8.0)

## 2014-08-14 LAB — CBC WITH DIFFERENTIAL/PLATELET
BASOS ABS: 0 10*3/uL (ref 0–0.1)
Basophils Relative: 0 %
EOS ABS: 0 10*3/uL (ref 0–0.7)
EOS PCT: 0 %
HCT: 40.2 % (ref 35.0–47.0)
HEMOGLOBIN: 13.3 g/dL (ref 12.0–16.0)
LYMPHS PCT: 15 %
Lymphs Abs: 1.4 10*3/uL (ref 1.0–3.6)
MCH: 29.3 pg (ref 26.0–34.0)
MCHC: 33.2 g/dL (ref 32.0–36.0)
MCV: 88.3 fL (ref 80.0–100.0)
MONO ABS: 1.3 10*3/uL — AB (ref 0.2–0.9)
Monocytes Relative: 14 %
NEUTROS ABS: 6.7 10*3/uL — AB (ref 1.4–6.5)
NEUTROS PCT: 71 %
Platelets: 327 10*3/uL (ref 150–440)
RBC: 4.55 MIL/uL (ref 3.80–5.20)
RDW: 15.6 % — ABNORMAL HIGH (ref 11.5–14.5)
WBC: 9.4 10*3/uL (ref 3.6–11.0)

## 2014-08-14 LAB — LACTIC ACID, PLASMA: Lactic Acid, Venous: 0.9 mmol/L (ref 0.5–2.0)

## 2014-08-14 LAB — PREGNANCY, URINE: Preg Test, Ur: NEGATIVE

## 2014-08-14 MED ORDER — SODIUM CHLORIDE 0.9 % IV BOLUS (SEPSIS)
1000.0000 mL | Freq: Once | INTRAVENOUS | Status: AC
  Administered 2014-08-14: 1000 mL via INTRAVENOUS
  Filled 2014-08-14: qty 1000

## 2014-08-14 MED ORDER — ACETAMINOPHEN 325 MG PO TABS
650.0000 mg | ORAL_TABLET | Freq: Once | ORAL | Status: AC
  Administered 2014-08-14: 650 mg via ORAL
  Filled 2014-08-14: qty 2

## 2014-08-14 MED ORDER — DEXTROSE 5 % IV SOLN
1.0000 g | Freq: Once | INTRAVENOUS | Status: AC
  Administered 2014-08-14: 1 g via INTRAVENOUS
  Filled 2014-08-14: qty 10

## 2014-08-14 MED ORDER — IBUPROFEN 800 MG PO TABS
800.0000 mg | ORAL_TABLET | Freq: Once | ORAL | Status: AC
  Administered 2014-08-14: 800 mg via ORAL
  Filled 2014-08-14: qty 1

## 2014-08-14 MED ORDER — CEPHALEXIN 500 MG PO CAPS: 500.0000 mg | ORAL_CAPSULE | Freq: Three times a day (TID) | ORAL | Status: AC

## 2014-08-14 NOTE — Discharge Instructions (Signed)
Please seek medical attention for any high fevers, chest pain, shortness of breath, change in behavior, persistent vomiting, bloody stool or any other new or concerning symptoms. ° °Pyelonephritis, Adult °Pyelonephritis is a kidney infection. In general, there are 2 main types of pyelonephritis: °· Infections that come on quickly without any warning (acute pyelonephritis). °· Infections that persist for a long period of time (chronic pyelonephritis). °CAUSES  °Two main causes of pyelonephritis are: °· Bacteria traveling from the bladder to the kidney. This is a problem especially in pregnant women. The urine in the bladder can become filled with bacteria from multiple causes, including: °¨ Inflammation of the prostate gland (prostatitis). °¨ Sexual intercourse in females. °¨ Bladder infection (cystitis). °· Bacteria traveling from the bloodstream to the tissue part of the kidney. °Problems that may increase your risk of getting a kidney infection include: °· Diabetes. °· Kidney stones or bladder stones. °· Cancer. °· Catheters placed in the bladder. °· Other abnormalities of the kidney or ureter. °SYMPTOMS  °· Abdominal pain. °· Pain in the side or flank area. °· Fever. °· Chills. °· Upset stomach. °· Blood in the urine (dark urine). °· Frequent urination. °· Strong or persistent urge to urinate. °· Burning or stinging when urinating. °DIAGNOSIS  °Your caregiver may diagnose your kidney infection based on your symptoms. A urine sample may also be taken. °TREATMENT  °In general, treatment depends on how severe the infection is.  °· If the infection is mild and caught early, your caregiver may treat you with oral antibiotics and send you home. °· If the infection is more severe, the bacteria may have gotten into the bloodstream. This will require intravenous (IV) antibiotics and a hospital stay. Symptoms may include: °¨ High fever. °¨ Severe flank pain. °¨ Shaking chills. °· Even after a hospital stay, your caregiver  may require you to be on oral antibiotics for a period of time. °· Other treatments may be required depending upon the cause of the infection. °HOME CARE INSTRUCTIONS  °· Take your antibiotics as directed. Finish them even if you start to feel better. °· Make an appointment to have your urine checked to make sure the infection is gone. °· Drink enough fluids to keep your urine clear or pale yellow. °· Take medicines for the bladder if you have urgency and frequency of urination as directed by your caregiver. °SEEK IMMEDIATE MEDICAL CARE IF:  °· You have a fever or persistent symptoms for more than 2-3 days. °· You have a fever and your symptoms suddenly get worse. °· You are unable to take your antibiotics or fluids. °· You develop shaking chills. °· You experience extreme weakness or fainting. °· There is no improvement after 2 days of treatment. °MAKE SURE YOU: °· Understand these instructions. °· Will watch your condition. °· Will get help right away if you are not doing well or get worse. °Document Released: 01/11/2005 Document Revised: 07/13/2011 Document Reviewed: 06/17/2010 °[employer]® Patient Information ©2015 [employer]. This information is not intended to replace advice given to you by your health care provider. Make sure you discuss any questions you have with your health care provider. ° °

## 2014-08-14 NOTE — ED Notes (Signed)
Pt informed that urine needed for UA, pt states she is unable at this time but will try again

## 2014-08-14 NOTE — ED Provider Notes (Signed)
[facility] Emergency Department Provider Note  ____________________________________________  Time seen: On EMS arrival  I have reviewed the triage vital signs and the nursing notes.   HISTORY  Chief Complaint Flank Pain   History limited by: Not Limited   HPI Emma [name] is a 39 y.o. female who presents to the emergency department today secondary to right flank pain. She states this is been going on for 2 days. She describes it as severe. It has been constant. It is worse with movement. Patient states she has associated dysuria. She also states that she has noticed a feeling of incomplete voiding and a foul odor to her urine. Furthermore she has had fevers during this time.   Past Medical History  Diagnosis Date  . Asthma   . Meningitis   . Lactose intolerance     There are no active problems to display for this patient.   Past Surgical History  Procedure Laterality Date  . Tubal ligation      Current Outpatient Rx  Name  Route  Sig  Dispense  Refill  . amoxicillin (AMOXIL) 500 MG capsule   Oral   Take 500 mg by mouth once as needed (for possible infection/ear pain).         . ciprofloxacin (CIPRO) 500 MG tablet   Oral   Take 1 tablet (500 mg total) by mouth every 12 (twelve) hours.   10 tablet   0   . clindamycin (CLEOCIN) 150 MG capsule   Oral   Take 1 capsule (150 mg total) by mouth 4 (four) times daily.   28 capsule   0   . HYDROcodone-acetaminophen (NORCO/VICODIN) 5-325 MG per tablet   Oral   Take 1 tablet by mouth every 4 (four) hours as needed for severe pain.   10 tablet   0   . ibuprofen (ADVIL,MOTRIN) 600 MG tablet   Oral   Take 1 tablet (600 mg total) by mouth every 6 (six) hours as needed.   30 tablet   0   . ibuprofen (ADVIL,MOTRIN) 800 MG tablet   Oral   Take 1 tablet (800 mg total) by mouth 3 (three) times daily.   21 tablet   0   . ibuprofen (ADVIL,MOTRIN) 800 MG tablet   Oral   Take 800 mg by  mouth once as needed for moderate pain.         Marland Kitchen oxyCODONE-acetaminophen (PERCOCET/ROXICET) 5-325 MG per tablet   Oral   Take 1-2 tablets by mouth every 4 (four) hours as needed for moderate pain or severe pain.   10 tablet   0     Allergies Penicillins and Zithromax  History reviewed. No pertinent family history.  Social History History  Substance Use Topics  . Smoking status: Current Every Day Smoker  . Smokeless tobacco: Not on file  . Alcohol Use: Yes     Comment: occ    Review of Systems  Constitutional: Positive for fever. Cardiovascular: Negative for chest pain. Respiratory: Negative for shortness of breath. Gastrointestinal: Positive for right flank pain Genitourinary: Negative for dysuria. Musculoskeletal: Negative for back pain. Skin: Negative for rash. Neurological: Negative for headaches, focal weakness or numbness.   10-point ROS otherwise negative.  ____________________________________________   PHYSICAL EXAM:  VITAL SIGNS: ED Triage Vitals  Enc Vitals Group     BP 08/14/14 2002 98/79 mmHg     Pulse Rate 08/14/14 2002 96     Resp 08/14/14 2002 19  Temp 08/14/14 2002 102.3 F (39.1 C)     Temp Source 08/14/14 2002 Oral     SpO2 08/14/14 2002 96 %     Weight --      Height --      Head Cir --      Peak Flow --      Pain Score 08/14/14 2004 10   Constitutional: Alert and oriented. Well appearing and in no distress. Eyes: Conjunctivae are normal. PERRL. Normal extraocular movements. ENT   Head: Normocephalic and atraumatic.   Nose: No congestion/rhinnorhea.   Mouth/Throat: Mucous membranes are moist.   Neck: No stridor. Hematological/Lymphatic/Immunilogical: No cervical lymphadenopathy. Cardiovascular: Normal rate, regular rhythm.  No murmurs, rubs, or gallops. Respiratory: Normal respiratory effort without tachypnea nor retractions. Breath sounds are clear and equal bilaterally. No  wheezes/rales/rhonchi. Gastrointestinal: Soft and nontender. No distention. Positive right-sided CVA tenderness Genitourinary: Deferred Musculoskeletal: Normal range of motion in all extremities. No joint effusions.  No lower extremity tenderness nor edema. Neurologic:  Normal speech and language. No gross focal neurologic deficits are appreciated. Speech is normal.  Skin:  Skin is warm, dry and intact. No rash noted. Psychiatric: Mood and affect are normal. Speech and behavior are normal. Patient exhibits appropriate insight and judgment.  ____________________________________________    LABS (pertinent positives/negatives)  Labs Reviewed  CBC WITH DIFFERENTIAL/PLATELET - Abnormal; Notable for the following:    RDW 15.6 (*)    Neutro Abs 6.7 (*)    Monocytes Absolute 1.3 (*)    All other components within normal limits  BASIC METABOLIC PANEL - Abnormal; Notable for the following:    Potassium 3.4 (*)    Glucose, Bld 104 (*)    BUN 5 (*)    All other components within normal limits  URINALYSIS COMPLETEWITH MICROSCOPIC ([facility] ONLY) - Abnormal; Notable for the following:    Color, Urine YELLOW (*)    APPearance CLOUDY (*)    Ketones, ur TRACE (*)    Hgb urine dipstick 3+ (*)    Protein, ur 100 (*)    Nitrite POSITIVE (*)    Leukocytes, UA 3+ (*)    Squamous Epithelial / LPF 0-5 (*)    All other components within normal limits  CULTURE, BLOOD (ROUTINE X 2)  CULTURE, BLOOD (ROUTINE X 2)  PREGNANCY, URINE  LACTIC ACID, PLASMA  LACTIC ACID, PLASMA     ____________________________________________   EKG  None  ____________________________________________    RADIOLOGY  None  ____________________________________________   PROCEDURES  Procedure(s) performed: None  Critical Care performed: No  ____________________________________________   INITIAL IMPRESSION / ASSESSMENT AND PLAN / ED COURSE  Pertinent labs & imaging results that were available during my care  of the patient were reviewed by me and considered in my medical decision making (see chart for details).  Patient presents to the emergency department today because of right flank pain, dysuria for 2 days. Urine is consistent with a urinary tract infection. Based on the findings and clinical exam I think patient likely suffering from pyelonephritis. Blood work without any concerning findings. Will give dose of IV antibiotics here and discharged home on oral antibiotics.  ____________________________________________   FINAL CLINICAL IMPRESSION(S) / ED DIAGNOSES  Final diagnoses:  Pyelonephritis     Phineas SemenGraydon Maalik Pinn, MD 08/14/14 [id number]

## 2014-08-14 NOTE — ED Notes (Signed)
Right flank pain x2 days with fever, burning senstation with urination.

## 2014-08-14 NOTE — Progress Notes (Signed)
   08/14/14 2200  Clinical Encounter Type  Visited With Patient  Visit Type Spiritual support  Spiritual Encounters  Spiritual Needs Prayer  Stress Factors  Patient Stress Factors Health changes   Status: alert but very anxious/ED Family: none present Age/Sex: [id number] female Visit Assessment: The chaplain prayed for healing and comfort and for the patient's 7 kids by request. The patient shared that she has stair step aged kids and one of her older kids will not speak to her. The patient shared that she has childhood chronic kidney disease and one of her kids has schitzophrenia. Chaplain offered encouraging words and actively listened.  Chaplains and pastoral care can be reached by pager [phone] and by online request.

## 2014-08-19 LAB — CULTURE, BLOOD (ROUTINE X 2)
Culture: NO GROWTH
Culture: NO GROWTH

## 2014-11-14 ENCOUNTER — Emergency Department
Admission: EM | Admit: 2014-11-14 | Discharge: 2014-11-14 | Disposition: A | Discharge status: Home / Self Care | Payer: Self-pay

## 2015-01-29 ENCOUNTER — Emergency Department
Admission: EM | Admit: 2015-01-29 | Discharge: 2015-01-29 | Disposition: A | Discharge status: Home / Self Care | Payer: Self-pay | Attending: Emergency Medicine | Admitting: Emergency Medicine

## 2015-01-29 ENCOUNTER — Encounter: Payer: Self-pay | Admitting: Emergency Medicine

## 2015-01-29 DIAGNOSIS — F17200 Nicotine dependence, unspecified, uncomplicated: Secondary | ICD-10-CM | POA: Insufficient documentation

## 2015-01-29 DIAGNOSIS — L0291 Cutaneous abscess, unspecified: Secondary | ICD-10-CM

## 2015-01-29 DIAGNOSIS — Z791 Long term (current) use of non-steroidal anti-inflammatories (NSAID): Secondary | ICD-10-CM | POA: Insufficient documentation

## 2015-01-29 DIAGNOSIS — Z880 Allergy status to penicillin: Secondary | ICD-10-CM | POA: Insufficient documentation

## 2015-01-29 DIAGNOSIS — Z79899 Other long term (current) drug therapy: Secondary | ICD-10-CM | POA: Insufficient documentation

## 2015-01-29 DIAGNOSIS — L02415 Cutaneous abscess of right lower limb: Secondary | ICD-10-CM | POA: Insufficient documentation

## 2015-01-29 DIAGNOSIS — Z792 Long term (current) use of antibiotics: Secondary | ICD-10-CM | POA: Insufficient documentation

## 2015-01-29 MED ORDER — TRAMADOL HCL 50 MG PO TABS
50.0000 mg | ORAL_TABLET | Freq: Once | ORAL | Status: AC
  Administered 2015-01-29: 50 mg via ORAL
  Filled 2015-01-29: qty 1

## 2015-01-29 MED ORDER — DOXYCYCLINE HYCLATE 100 MG PO TABS
100.0000 mg | ORAL_TABLET | Freq: Once | ORAL | Status: AC
  Administered 2015-01-29: 100 mg via ORAL
  Filled 2015-01-29: qty 1

## 2015-01-29 MED ORDER — TRAMADOL HCL 50 MG PO TABS: 50.0000 mg | ORAL_TABLET | Freq: Four times a day (QID) | ORAL | Status: DC | PRN

## 2015-01-29 MED ORDER — DOXYCYCLINE HYCLATE 100 MG PO CAPS: 100.0000 mg | ORAL_CAPSULE | Freq: Two times a day (BID) | ORAL | Status: DC

## 2015-01-29 NOTE — ED Notes (Signed)
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 There is an open area on her right lower shin that is open and appears to be draining some.  On the posterior part of her right knee is also an reddened area that is abscessed that the patient states she needs lanced. Patient states she needs antibiotics and pain medication.

## 2015-01-29 NOTE — ED Provider Notes (Signed)
[facility] Emergency Department Provider Note ____________________________________________  Time seen: Approximately 4:00 PM  I have reviewed the triage vital signs and the nursing notes.   HISTORY  Chief Complaint Abscess   HPI Emma [name] is a 40 y.o. female who presents to the emergency department for evaluation of abscess. 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 She states that she typically lances them at home, but there is 1 behind her right knee that she has been unable to reach. She states that the area has not been draining, but has been increasingly painful. She has not been taking anything at home for pain.   Past Medical History  Diagnosis Date  . Asthma   . Meningitis   . Lactose intolerance     There are no active problems to display for this patient.   Past Surgical History  Procedure Laterality Date  . Tubal ligation      Current Outpatient Rx  Name  Route  Sig  Dispense  Refill  . amoxicillin (AMOXIL) 500 MG capsule   Oral   Take 500 mg by mouth once as needed (for possible infection/ear pain).         . ciprofloxacin (CIPRO) 500 MG tablet   Oral   Take 1 tablet (500 mg total) by mouth every 12 (twelve) hours.   10 tablet   0   . doxycycline (VIBRAMYCIN) 100 MG capsule   Oral   Take 1 capsule (100 mg total) by mouth 2 (two) times daily.   20 capsule   0   . ibuprofen (ADVIL,MOTRIN) 600 MG tablet   Oral   Take 1 tablet (600 mg total) by mouth every 6 (six) hours as needed.   30 tablet   0   . ibuprofen (ADVIL,MOTRIN) 800 MG tablet   Oral   Take 1 tablet (800 mg total) by mouth 3 (three) times daily.   21 tablet   0   . ibuprofen (ADVIL,MOTRIN) 800 MG tablet   Oral   Take 800 mg by mouth once as needed for moderate pain.         . traMADol (ULTRAM) 50 MG tablet   Oral   Take 1 tablet (50 mg total) by mouth every 6 (six) hours as needed.   9 tablet  0     Allergies Penicillins and Zithromax  No family history on file.  Social History Social History  Substance Use Topics  . Smoking status: Current Every Day Smoker  . Smokeless tobacco: None  . Alcohol Use: Yes     Comment: occ    Review of Systems   Constitutional: No fever/chills Eyes: No visual changes. ENT: No congestion or rhinorrhea Cardiovascular: Denies chest pain. Respiratory: Denies shortness of breath. Gastrointestinal: No abdominal pain.  No nausea, no vomiting.  No diarrhea.  No constipation. Genitourinary: Negative for dysuria. Musculoskeletal: Negative for back pain. Skin: Positive for abscess. Neurological: Negative for headaches, focal weakness or numbness.  10-point ROS otherwise negative.  ____________________________________________   PHYSICAL EXAM:  VITAL SIGNS: ED Triage Vitals  Enc Vitals Group     BP 01/29/15 1449 113/80 mmHg     Pulse Rate 01/29/15 1449 77     Resp 01/29/15 1449 16     Temp 01/29/15 1449 98.1 F (36.7 C)     Temp Source 01/29/15 1449 Oral     SpO2 01/29/15 1449 99 %     Weight  01/29/15 1449 215 lb (97.523 kg)     Height 01/29/15 1449 5\' 6"  (1.676 m)     Head Cir --      Peak Flow --      Pain Score 01/29/15 1449 3     Pain Loc --      Pain Edu? --      Excl. in GC? --     Constitutional: Alert and oriented. Well appearing and in no acute distress. Eyes: Conjunctivae are normal. PERRL. EOMI. Head: Atraumatic. Nose: No congestion/rhinnorhea. Mouth/Throat: Mucous membranes are moist.  Oropharynx non-erythematous. No oral lesions. Neck: No stridor. Cardiovascular: Normal rate, regular rhythm.  Good peripheral circulation. Respiratory: Normal respiratory effort.  No retractions. Lungs CTAB. Gastrointestinal: Soft and nontender. No distention. No abdominal bruits.  Musculoskeletal: No lower extremity tenderness nor edema.  No joint effusions. Neurologic:  Normal speech and language. No gross focal neurologic  deficits are appreciated. Speech is normal. No gait instability. Skin:  Healing lesions to the right thigh without fluctuance or erythema. Single lesion noted to the posterior aspect of the right knee that is erythematous, nonfluctuant, and mildly indurated. Another single lesion noted to the right anterior pretibial area that is open and draining clear fluid--this is the abscess that the patient states that she opened at home; Negative for petechiae.  Psychiatric: Mood and affect are normal. Speech and behavior are normal.  ____________________________________________   LABS (all labs ordered are listed, but only abnormal results are displayed)  Labs Reviewed - No data to display ____________________________________________  EKG   ____________________________________________  RADIOLOGY   ____________________________________________   PROCEDURES  Procedure(s) performed: None ____________________________________________   INITIAL IMPRESSION / ASSESSMENT AND PLAN / ED COURSE  Pertinent labs & imaging results that were available during my care of the patient were reviewed by me and considered in my medical decision making (see chart for details).  Patient was instructed to take the doxycycline until finished and as prescribed. She was advised to take ibuprofen or Tylenol for pain and take the tramadol as needed for severe pain. She was advised to follow-up with her primary care provider for symptoms that are not improving over the next 2-3 days or return to the emergency department if unable to schedule an appointment. ____________________________________________   FINAL CLINICAL IMPRESSION(S) / ED DIAGNOSES  Final diagnoses:  Abscess       Chinita PesterCari B Ambrielle Kington, FNP 01/29/15 1917  Rockne MenghiniAnne-Caroline Norman, MD 01/29/15 2311

## 2015-01-29 NOTE — ED Notes (Signed)
Pt presents with several abscessed areas with drainage.

## 2015-01-29 NOTE — Discharge Instructions (Signed)

## 2015-09-05 ENCOUNTER — Emergency Department
Admission: EM | Admit: 2015-09-05 | Discharge: 2015-09-05 | Disposition: A | Discharge status: Home / Self Care | Payer: Self-pay | Attending: Emergency Medicine | Admitting: Emergency Medicine

## 2015-09-05 ENCOUNTER — Encounter: Payer: Self-pay | Admitting: Emergency Medicine

## 2015-09-05 DIAGNOSIS — Z792 Long term (current) use of antibiotics: Secondary | ICD-10-CM | POA: Insufficient documentation

## 2015-09-05 DIAGNOSIS — Z791 Long term (current) use of non-steroidal anti-inflammatories (NSAID): Secondary | ICD-10-CM | POA: Insufficient documentation

## 2015-09-05 DIAGNOSIS — W57XXXA Bitten or stung by nonvenomous insect and other nonvenomous arthropods, initial encounter: Secondary | ICD-10-CM | POA: Insufficient documentation

## 2015-09-05 DIAGNOSIS — Y929 Unspecified place or not applicable: Secondary | ICD-10-CM | POA: Insufficient documentation

## 2015-09-05 DIAGNOSIS — L03115 Cellulitis of right lower limb: Secondary | ICD-10-CM | POA: Insufficient documentation

## 2015-09-05 DIAGNOSIS — J45909 Unspecified asthma, uncomplicated: Secondary | ICD-10-CM | POA: Insufficient documentation

## 2015-09-05 DIAGNOSIS — Y9301 Activity, walking, marching and hiking: Secondary | ICD-10-CM | POA: Insufficient documentation

## 2015-09-05 DIAGNOSIS — F17200 Nicotine dependence, unspecified, uncomplicated: Secondary | ICD-10-CM | POA: Insufficient documentation

## 2015-09-05 DIAGNOSIS — Y999 Unspecified external cause status: Secondary | ICD-10-CM | POA: Insufficient documentation

## 2015-09-05 MED ORDER — OXYCODONE-ACETAMINOPHEN 5-325 MG PO TABS
1.0000 | ORAL_TABLET | Freq: Once | ORAL | Status: AC
  Administered 2015-09-05: 1 via ORAL
  Filled 2015-09-05: qty 1

## 2015-09-05 MED ORDER — SULFAMETHOXAZOLE-TRIMETHOPRIM 800-160 MG PO TABS: 2.0000 | ORAL_TABLET | Freq: Two times a day (BID) | ORAL | 0 refills | Status: DC

## 2015-09-05 MED ORDER — OXYCODONE-ACETAMINOPHEN 5-325 MG PO TABS: 1.0000 | ORAL_TABLET | Freq: Four times a day (QID) | ORAL | 0 refills | Status: DC | PRN

## 2015-09-05 MED ORDER — CEPHALEXIN 500 MG PO CAPS: 500.0000 mg | ORAL_CAPSULE | Freq: Four times a day (QID) | ORAL | 0 refills | Status: AC

## 2015-09-05 MED ORDER — CEPHALEXIN 500 MG PO CAPS
500.0000 mg | ORAL_CAPSULE | Freq: Once | ORAL | Status: AC
  Administered 2015-09-05: 500 mg via ORAL
  Filled 2015-09-05: qty 1

## 2015-09-05 MED ORDER — SULFAMETHOXAZOLE-TRIMETHOPRIM 800-160 MG PO TABS
2.0000 | ORAL_TABLET | Freq: Once | ORAL | Status: AC
  Administered 2015-09-05: 2 via ORAL
  Filled 2015-09-05: qty 2

## 2015-09-05 NOTE — ED Triage Notes (Signed)
Pt felt spider bite her 2 days ago. Wound has been getting worse. Right calf has swelling, redness and warmth. Also has right groin pain. Blackish color to center.

## 2015-09-05 NOTE — ED Provider Notes (Signed)
[facility] Emergency Department Provider Note   ____________________________________________   First MD Initiated Contact with Patient 09/05/15 1538     (approximate)  I have reviewed the triage vital signs and the nursing notes.   HISTORY  Chief Complaint Insect Bite   HPI Emma [name] is a 40 y.o. 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 She said that she was walking when she felt what she thought was a bite to the right lower extremity. She suspects another brown recluse bite. She said that since the bite area has become red with a darkened center. She also has some pain in the right groin especially when she walks. Denies any fever. Says that she is put ice to the area and the redness has decreased.  Denies a history of diabetes. Past Medical History:  Diagnosis Date  . Asthma   . Lactose intolerance   . Meningitis     There are no active problems to display for this patient.   Past Surgical History:  Procedure Laterality Date  . TUBAL LIGATION      Prior to Admission medications   Medication Sig Start Date End Date Taking? Authorizing Provider  amoxicillin (AMOXIL) 500 MG capsule Take 500 mg by mouth once as needed (for possible infection/ear pain).    Historical Provider, MD  ciprofloxacin (CIPRO) 500 MG tablet Take 1 tablet (500 mg total) by mouth every 12 (twelve) hours. 01/16/13   Felicie Mornavid Smith, NP  doxycycline (VIBRAMYCIN) 100 MG capsule Take 1 capsule (100 mg total) by mouth 2 (two) times daily. 01/29/15   Chinita Pesterari B Triplett, FNP  ibuprofen (ADVIL,MOTRIN) 600 MG tablet Take 1 tablet (600 mg total) by mouth every 6 (six) hours as needed. 10/11/13   Raeford RazorStephen Kohut, MD  ibuprofen (ADVIL,MOTRIN) 800 MG tablet Take 1 tablet (800 mg total) by mouth 3 (three) times daily. 01/16/13   Felicie Mornavid Smith, NP    ibuprofen (ADVIL,MOTRIN) 800 MG tablet Take 800 mg by mouth once as needed for moderate pain.    Historical Provider, MD  traMADol (ULTRAM) 50 MG tablet Take 1 tablet (50 mg total) by mouth every 6 (six) hours as needed. 01/29/15   Chinita Pesterari B Triplett, FNP    Allergies Penicillins and Zithromax [azithromycin]  History reviewed. No pertinent family history.  Social History Social History  Substance Use Topics  . Smoking status: Current Every Day Smoker  . Smokeless tobacco: Not on file  . Alcohol use Yes     Comment: occ    Review of Systems Constitutional: No fever/chills Eyes: No visual changes. ENT: No sore throat. Cardiovascular: Denies chest pain. Respiratory: Denies shortness of breath. Gastrointestinal: No abdominal pain.  No nausea, no vomiting.  No diarrhea.  No constipation. Genitourinary: Negative for dysuria. Musculoskeletal: Negative for back pain. Skin: As above Neurological: Negative for headaches, focal weakness or numbness.  10-point ROS otherwise negative.  ____________________________________________   PHYSICAL EXAM:  VITAL SIGNS: ED Triage Vitals [09/05/15 1528]  Enc Vitals Group     BP 126/65     Pulse Rate 97     Resp 18     Temp 98.6 F (37 C)     Temp Source Oral     SpO2 100 %     Weight 223 lb (101.2 kg)     Height 5\' 7"  (1.702 m)  Head Circumference      Peak Flow      Pain Score 6     Pain Loc      Pain Edu?      Excl. in GC?     Constitutional: Alert and oriented. Well appearing and in no acute distress. Eyes: Conjunctivae are normal. PERRL. EOMI. Head: Atraumatic. Nose: No congestion/rhinnorhea. Mouth/Throat: Mucous membranes are moist.   Neck: No stridor.   Cardiovascular: Normal rate, regular rhythm. Grossly normal heart sounds.  Good peripheral circulation with bilateral lower extremity dorsalis pedis pulses that are intact Respiratory: Normal respiratory effort.  No retractions. Lungs CTAB. Gastrointestinal: Soft and  nontender. No distention.   Musculoskeletal: No lower extremity tenderness nor edema.  No joint effusions. Neurologic:  Normal speech and language. No gross focal neurologic deficits are appreciated. No gait instability. Skin:  Right lower extremity with lateral area of erythema which is about 10 cm in circumference. It is tender and indurated with a central area of about 2 cm that is circular that is darkened but without any crepitus, fluctuance or pus. There is also tenderness along the inguinal ligament chain in the right groin.   Psychiatric: Mood and affect are normal. Speech and behavior are normal.  ____________________________________________   LABS (all labs ordered are listed, but only abnormal results are displayed)  Labs Reviewed - No data to display ____________________________________________  EKG   ____________________________________________  RADIOLOGY   ____________________________________________   PROCEDURES  Procedure(s) performed:   Procedures  Critical Care performed:   ____________________________________________   INITIAL IMPRESSION / ASSESSMENT AND PLAN / ED COURSE  Pertinent labs & imaging results that were available during my care of the patient were reviewed by me and considered in my medical decision making (see chart for details).  Patient is nontoxic in appearance. Is afebrile. Appears to be a simple cellulitis is likely a reactive lymph node to the right inguinal area. She'll be given a first dose of Bactrim and Keflex here as well as a dose of Percocet. She'll be discharged home. She is requesting a work note as she is on her feet for 12-14 hours a day which I think is appropriate. We also discussed follow-up and she'll be following with her primary care doctor early next week on Monday or Tuesday. We also discussed return precautions including worsening of her symptoms as well as any fever or increase in pain or the size of the area of  redness. She is understanding of this plan and willing to comply.  Clinical Course     ____________________________________________   FINAL CLINICAL IMPRESSION(S) / ED DIAGNOSES Right lower extremity cellulitis.     NEW MEDICATIONS STARTED DURING THIS VISIT:  New Prescriptions   No medications on file     Note:  This document was prepared using Dragon voice recognition software and may include unintentional dictation errors.    Norina Blazer, MD 09/05/15 1556

## 2015-09-17 ENCOUNTER — Emergency Department
Admission: EM | Admit: 2015-09-17 | Discharge: 2015-09-17 | Disposition: A | Discharge status: Home / Self Care | Payer: Self-pay | Attending: Emergency Medicine | Admitting: Emergency Medicine

## 2015-09-17 ENCOUNTER — Encounter: Payer: Self-pay | Admitting: Medical Oncology

## 2015-09-17 DIAGNOSIS — T63301D Toxic effect of unspecified spider venom, accidental (unintentional), subsequent encounter: Secondary | ICD-10-CM | POA: Insufficient documentation

## 2015-09-17 DIAGNOSIS — F17200 Nicotine dependence, unspecified, uncomplicated: Secondary | ICD-10-CM | POA: Insufficient documentation

## 2015-09-17 DIAGNOSIS — J45909 Unspecified asthma, uncomplicated: Secondary | ICD-10-CM | POA: Insufficient documentation

## 2015-09-17 MED ORDER — MUPIROCIN CALCIUM 2 % EX CREA
TOPICAL_CREAM | Freq: Once | CUTANEOUS | Status: DC
  Filled 2015-09-17: qty 15

## 2015-09-17 MED ORDER — SULFAMETHOXAZOLE-TRIMETHOPRIM 800-160 MG PO TABS: 1.0000 | ORAL_TABLET | Freq: Two times a day (BID) | ORAL | 0 refills | Status: DC

## 2015-09-17 MED ORDER — BACITRACIN ZINC 500 UNIT/GM EX OINT
TOPICAL_OINTMENT | CUTANEOUS | Status: AC
  Administered 2015-09-17: 1 via TOPICAL
  Filled 2015-09-17: qty 0.9

## 2015-09-17 MED ORDER — CEPHALEXIN 500 MG PO CAPS: 500.0000 mg | ORAL_CAPSULE | Freq: Four times a day (QID) | ORAL | 0 refills | Status: AC

## 2015-09-17 MED ORDER — BACITRACIN ZINC 500 UNIT/GM EX OINT
TOPICAL_OINTMENT | Freq: Once | CUTANEOUS | Status: AC
  Administered 2015-09-17: 1 via TOPICAL

## 2015-09-17 MED ORDER — OXYCODONE-ACETAMINOPHEN 5-325 MG PO TABS: 1.0000 | ORAL_TABLET | ORAL | 0 refills | Status: DC | PRN

## 2015-09-17 NOTE — ED Provider Notes (Signed)
[facility] Emergency Department Provider Note  ____________________________________________  Time seen: Approximately 11:53 AM  I have reviewed the triage vital signs and the nursing notes.   HISTORY  Chief Complaint Insect Bite    HPI Emma [name] is a 40 y.o. female presents for evaluation of spider bite to her right lower leg. Patient has seen here last week for the same but lost her antibiotics and remove. Desires a refill on antibiotics. States that the infection seems to be draining a little bit.   Past Medical History:  Diagnosis Date  . Asthma   . Lactose intolerance   . Meningitis     There are no active problems to display for this patient.   Past Surgical History:  Procedure Laterality Date  . TUBAL LIGATION      Prior to Admission medications   Medication Sig Start Date End Date Taking? Authorizing Provider  cephALEXin (KEFLEX) 500 MG capsule Take 1 capsule (500 mg total) by mouth 4 (four) times daily. 09/17/15 09/27/15  Charmayne Sheerharles M Beers, PA-C  oxyCODONE-acetaminophen (ROXICET) 5-325 MG tablet Take 1-2 tablets by mouth every 4 (four) hours as needed for severe pain. 09/17/15   Charmayne Sheerharles M Beers, PA-C  sulfamethoxazole-trimethoprim (BACTRIM DS,SEPTRA DS) 800-160 MG tablet Take 1 tablet by mouth 2 (two) times daily. 09/17/15   Evangeline Dakinharles M Beers, PA-C    Allergies Penicillins and Zithromax [azithromycin]  No family history on file.  Social History Social History  Substance Use Topics  . Smoking status: Current Every Day Smoker  . Smokeless tobacco: Not on file  . Alcohol use Yes     Comment: occ    Review of Systems Constitutional: No fever/chills Eyes: No visual changes. ENT: No sore throat. Cardiovascular: Denies chest pain. Respiratory: Denies shortness of breath. Gastrointestinal: No abdominal pain.  No nausea, no vomiting.  No diarrhea.  No constipation. Genitourinary: Negative for dysuria. Musculoskeletal: Negative for  back pain. Skin: Positive to similar spider bite right lower leg Neurological: Negative for headaches, focal weakness or numbness.  10-point ROS otherwise negative.  ____________________________________________   PHYSICAL EXAM:  VITAL SIGNS: ED Triage Vitals  Enc Vitals Group     BP 09/17/15 1133 100/76     Pulse Rate 09/17/15 1133 85     Resp 09/17/15 1133 18     Temp 09/17/15 1133 97.9 F (36.6 C)     Temp Source 09/17/15 1133 Oral     SpO2 09/17/15 1133 96 %     Weight 09/17/15 1132 223 lb (101.2 kg)     Height 09/17/15 1132 5\' 7"  (1.702 m)     Head Circumference --      Peak Flow --      Pain Score 09/17/15 1132 4     Pain Loc --      Pain Edu? --      Excl. in GC? --     Constitutional: Alert and oriented. Well appearing and in no acute distress. Cardiovascular: Normal rate, regular rhythm. Grossly normal heart sounds.  Good peripheral circulation. Respiratory: Normal respiratory effort.  No retractions. Lungs CTAB. Gastrointestinal: Soft and nontender. No distention. No abdominal bruits. No CVA tenderness. Musculoskeletal: No lower extremity tenderness nor edema.  No joint effusions. Neurologic:  Normal speech and language. No gross focal neurologic deficits are appreciated. No gait instability. Skin:  2 cm open lesion to the right lower leg pustular drainage. No erythema positive warmth. Psychiatric: Mood and affect are normal. Speech and behavior are normal.  ____________________________________________   LABS (all labs ordered are listed, but only abnormal results are displayed)  Labs Reviewed - No data to display ____________________________________________  EKG   ____________________________________________  RADIOLOGY   ____________________________________________   PROCEDURES  Procedure(s) performed: None  Critical Care performed: No  ____________________________________________   INITIAL IMPRESSION / ASSESSMENT AND PLAN / ED  COURSE  Pertinent labs & imaging results that were available during my care of the patient were reviewed by me and considered in my medical decision making (see chart for details). Review of the [place] CSRS was performed in accordance of the [facility] prior to dispensing any controlled drugs.  Spider bite continuous. Rx given for refill of Bactrim and Keflex. Rx refill given for Percocet 5/325 for pain. She is to follow-up with the on-call surgeon as needed for further evaluation. Work excuse 24 hours given.  Clinical Course    ____________________________________________   FINAL CLINICAL IMPRESSION(S) / ED DIAGNOSES  Final diagnoses:  Spider bite wound, accidental or unintentional, subsequent encounter     This chart was dictated using voice recognition software/Dragon. Despite best efforts to proofread, errors can occur which can change the meaning. Any change was purely unintentional.    Evangeline Dakinharles M Beers, PA-C 09/17/15 1218    Charmayne Sheerharles M Beers, PA-C 09/17/15 [phone]    Emily FilbertJonathan E Williams, MD 09/17/15 [phone]

## 2015-09-17 NOTE — ED Triage Notes (Signed)
Pt has bug bite to rt leg. Was seen 2 days ago given abx but she lost them.

## 2015-09-23 ENCOUNTER — Encounter: Payer: Self-pay | Admitting: Emergency Medicine

## 2015-09-23 ENCOUNTER — Emergency Department
Admission: EM | Admit: 2015-09-23 | Discharge: 2015-09-23 | Disposition: A | Discharge status: Home / Self Care | Payer: No Typology Code available for payment source | Attending: Emergency Medicine | Admitting: Emergency Medicine

## 2015-09-23 ENCOUNTER — Emergency Department: Payer: No Typology Code available for payment source

## 2015-09-23 DIAGNOSIS — Y92410 Unspecified street and highway as the place of occurrence of the external cause: Secondary | ICD-10-CM | POA: Insufficient documentation

## 2015-09-23 DIAGNOSIS — M549 Dorsalgia, unspecified: Secondary | ICD-10-CM | POA: Diagnosis not present

## 2015-09-23 DIAGNOSIS — F17200 Nicotine dependence, unspecified, uncomplicated: Secondary | ICD-10-CM | POA: Insufficient documentation

## 2015-09-23 DIAGNOSIS — Y939 Activity, unspecified: Secondary | ICD-10-CM | POA: Diagnosis not present

## 2015-09-23 DIAGNOSIS — M79604 Pain in right leg: Secondary | ICD-10-CM | POA: Diagnosis not present

## 2015-09-23 DIAGNOSIS — J45909 Unspecified asthma, uncomplicated: Secondary | ICD-10-CM | POA: Insufficient documentation

## 2015-09-23 DIAGNOSIS — M791 Myalgia: Secondary | ICD-10-CM | POA: Insufficient documentation

## 2015-09-23 DIAGNOSIS — Y999 Unspecified external cause status: Secondary | ICD-10-CM | POA: Insufficient documentation

## 2015-09-23 DIAGNOSIS — M7918 Myalgia, other site: Secondary | ICD-10-CM

## 2015-09-23 MED ORDER — TRAMADOL HCL 50 MG PO TABS
50.0000 mg | ORAL_TABLET | Freq: Once | ORAL | Status: DC
Start: 2015-09-23 — End: 2015-09-23
  Filled 2015-09-23: qty 1

## 2015-09-23 MED ORDER — IBUPROFEN 600 MG PO TABS
600.0000 mg | ORAL_TABLET | Freq: Once | ORAL | Status: AC
  Administered 2015-09-23: 600 mg via ORAL
  Filled 2015-09-23: qty 1

## 2015-09-23 MED ORDER — METHOCARBAMOL 500 MG PO TABS
1000.0000 mg | ORAL_TABLET | Freq: Once | ORAL | Status: AC
  Administered 2015-09-23: 1000 mg via ORAL
  Filled 2015-09-23: qty 2

## 2015-09-23 MED ORDER — METHOCARBAMOL 750 MG PO TABS: 750.0000 mg | ORAL_TABLET | Freq: Four times a day (QID) | ORAL | 0 refills | Status: DC

## 2015-09-23 MED ORDER — TRAMADOL HCL 50 MG PO TABS: 50.0000 mg | ORAL_TABLET | Freq: Four times a day (QID) | ORAL | 0 refills | Status: AC | PRN
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
## 2015-09-25 ENCOUNTER — Encounter: Payer: Self-pay | Admitting: Surgery

## 2015-09-26 ENCOUNTER — Ambulatory Visit: Payer: Self-pay | Admitting: Surgery

## 2016-05-26 ENCOUNTER — Encounter: Payer: Self-pay | Admitting: Emergency Medicine

## 2016-05-26 ENCOUNTER — Emergency Department
Admission: EM | Admit: 2016-05-26 | Discharge: 2016-05-26 | Disposition: A | Discharge status: Home / Self Care | Payer: Self-pay | Attending: Emergency Medicine | Admitting: Emergency Medicine

## 2016-05-26 DIAGNOSIS — R55 Syncope and collapse: Secondary | ICD-10-CM

## 2016-05-26 DIAGNOSIS — J45909 Unspecified asthma, uncomplicated: Secondary | ICD-10-CM | POA: Insufficient documentation

## 2016-05-26 DIAGNOSIS — F17210 Nicotine dependence, cigarettes, uncomplicated: Secondary | ICD-10-CM | POA: Insufficient documentation

## 2016-05-26 LAB — CBC
HEMATOCRIT: 39.6 % (ref 35.0–47.0)
HEMOGLOBIN: 13.2 g/dL (ref 12.0–16.0)
MCH: 29.4 pg (ref 26.0–34.0)
MCHC: 33.4 g/dL (ref 32.0–36.0)
MCV: 88 fL (ref 80.0–100.0)
Platelets: 409 10*3/uL (ref 150–440)
RBC: 4.5 MIL/uL (ref 3.80–5.20)
RDW: 15.1 % — ABNORMAL HIGH (ref 11.5–14.5)
WBC: 6 10*3/uL (ref 3.6–11.0)

## 2016-05-26 LAB — URINALYSIS, COMPLETE (UACMP) WITH MICROSCOPIC
BACTERIA UA: NONE SEEN
Bilirubin Urine: NEGATIVE
Glucose, UA: NEGATIVE mg/dL
KETONES UR: NEGATIVE mg/dL
Nitrite: NEGATIVE
PROTEIN: 30 mg/dL — AB
Specific Gravity, Urine: 1.023 (ref 1.005–1.030)
pH: 5 (ref 5.0–8.0)

## 2016-05-26 LAB — BASIC METABOLIC PANEL
ANION GAP: 4 — AB (ref 5–15)
BUN: 10 mg/dL (ref 6–20)
CO2: 25 mmol/L (ref 22–32)
Calcium: 8.6 mg/dL — ABNORMAL LOW (ref 8.9–10.3)
Chloride: 107 mmol/L (ref 101–111)
Creatinine, Ser: 0.72 mg/dL (ref 0.44–1.00)
GFR calc Af Amer: >60 mL/min (ref >60)
GFR calc non Af Amer: >60 mL/min (ref >60)
GLUCOSE: 98 mg/dL (ref 65–99)
POTASSIUM: 3.7 mmol/L (ref 3.5–5.1)
Sodium: 136 mmol/L (ref 135–145)

## 2016-05-26 LAB — TSH: TSH: 1.186 u[IU]/mL (ref 0.350–4.500)

## 2016-05-26 MED ORDER — PROCHLORPERAZINE EDISYLATE 5 MG/ML IJ SOLN
10.0000 mg | Freq: Once | INTRAMUSCULAR | Status: AC
  Administered 2016-05-26: 10 mg via INTRAVENOUS
  Filled 2016-05-26 (×2): qty 2

## 2016-05-26 MED ORDER — SODIUM CHLORIDE 0.9 % IV BOLUS (SEPSIS)
1000.0000 mL | Freq: Once | INTRAVENOUS | Status: AC
  Administered 2016-05-26: 1000 mL via INTRAVENOUS

## 2016-05-26 MED ORDER — PROCHLORPERAZINE MALEATE 10 MG PO TABS: 10.0000 mg | ORAL_TABLET | Freq: Three times a day (TID) | ORAL | 0 refills | Status: DC | PRN

## 2016-05-26 NOTE — Discharge Instructions (Signed)
Please seek medical attention for any high fevers, chest pain, shortness of breath, change in behavior, persistent vomiting, bloody stool or any other new or concerning symptoms.  

## 2016-05-26 NOTE — ED Triage Notes (Signed)
Pt in via POV with complaints of dizziness and headache x 2-3 days, states she had to leave work yesterday due to dizziness, reports 2 syncopal episodes today at home, one of which she did hit her head on the floor.  Pt A/Ox4, denies any changes to vision.  Pt states she did pull a tick off of her 2 days ago as well.  NAD noted at this time.

## 2016-05-26 NOTE — ED Provider Notes (Signed)
[facility] Emergency Department Provider Note  ____________________________________________   I have reviewed the triage vital signs and the nursing notes.   HISTORY  Chief Complaint Loss of Consciousness   History limited by: Not Limited   HPI Emma [name] is a 41 y.o. female who presents to the emergency department today because of concerns for dizziness and syncopal episodes. The patient states that the symptoms have been going on for the past 2 days. She states that she will become dizzy and then passed out. This happened a couple of times. She feels like things are spinning around her. In addition for the past couple of days she noticed it is been very cold. She has not felt like she has had any fevers. She does think that she was bit by an insect near her right groin. She has not noticed any rashes. No headache, chest pain or shortness of breath   Past Medical History:  Diagnosis Date  . Asthma   . Lactose intolerance   . Meningitis     There are no active problems to display for this patient.   Past Surgical History:  Procedure Laterality Date  . TUBAL LIGATION      Prior to Admission medications   Medication Sig Start Date End Date Taking? Authorizing Provider  methocarbamol (ROBAXIN-750) 750 MG tablet Take 1 tablet (750 mg total) by mouth 4 (four) times daily. 09/23/15   Joni Reining, PA-C  oxyCODONE-acetaminophen (ROXICET) 5-325 MG tablet Take 1-2 tablets by mouth every 4 (four) hours as needed for severe pain. 09/17/15   Charmayne Sheer Beers, PA-C  sulfamethoxazole-trimethoprim (BACTRIM DS,SEPTRA DS) 800-160 MG tablet Take 1 tablet by mouth 2 (two) times daily. 09/17/15   Charmayne Sheer Beers, PA-C  traMADol (ULTRAM) 50 MG tablet Take 1 tablet (50 mg total) by mouth every 6 (six) hours as needed. 09/23/15 09/22/16  Joni Reining, PA-C    Allergies Penicillins; Sulfa antibiotics; and Zithromax [azithromycin]  No family history on  file.  Social History Social History  Substance Use Topics  . Smoking status: Current Every Day Smoker    Packs/day: 1.00    Types: Cigarettes  . Smokeless tobacco: Never Used  . Alcohol use Yes     Comment: occassional    Review of Systems Constitutional: No fever/chills Eyes: No visual changes. ENT: No sore throat. Cardiovascular: Denies chest pain. Respiratory: Denies shortness of breath. Gastrointestinal: No abdominal pain.  No nausea, no vomiting.  No diarrhea.   Genitourinary: Negative for dysuria. Musculoskeletal: Negative for back pain. Skin: Negative for rash. Neurological: Positive for dizziness.  ____________________________________________   PHYSICAL EXAM:  VITAL SIGNS: ED Triage Vitals  Enc Vitals Group     BP 05/26/16 1817 (!) 108/55     Pulse Rate 05/26/16 1817 73     Resp 05/26/16 1817 18     Temp 05/26/16 1817 98.5 F (36.9 C)     Temp Source 05/26/16 1817 Oral     SpO2 05/26/16 1817 100 %     Weight 05/26/16 1818 200 lb (90.7 kg)     Height 05/26/16 1818  (1.676 m)    Constitutional: Alert and oriented. Well appearing and in no distress. Eyes: Conjunctivae are normal. Normal extraocular movements. ENT   Head: Normocephalic and atraumatic.   Nose: No congestion/rhinnorhea.   Mouth/Throat: Mucous membranes are moist.   Neck: No stridor. Hematological/Lymphatic/Immunilogical: No cervical lymphadenopathy. Cardiovascular: Normal rate, regular rhythm.  No murmurs, rubs, or gallops. Respiratory: Normal  respiratory effort without tachypnea nor retractions. Breath sounds are clear and equal bilaterally. No wheezes/rales/rhonchi. Gastrointestinal: Soft and non tender. No rebound. No guarding.  Genitourinary: Deferred Musculoskeletal: Normal range of motion in all extremities. No lower extremity edema. Neurologic:  Normal speech and language. No gross focal neurologic deficits are appreciated.  Skin:  Skin is warm, dry and intact. No  rash noted. Psychiatric: Mood and affect are normal. Speech and behavior are normal. Patient exhibits appropriate insight and judgment.  ____________________________________________    LABS (pertinent positives/negatives)  Labs Reviewed  BASIC METABOLIC PANEL - Abnormal; Notable for the following:       Result Value   Calcium 8.6 (*)    Anion gap 4 (*)    All other components within normal limits  CBC - Abnormal; Notable for the following:    RDW 15.1 (*)    All other components within normal limits  URINALYSIS, COMPLETE (UACMP) WITH MICROSCOPIC - Abnormal; Notable for the following:    Color, Urine YELLOW (*)    APPearance HAZY (*)    Hgb urine dipstick MODERATE (*)    Protein, ur 30 (*)    Leukocytes, UA TRACE (*)    Squamous Epithelial / LPF 6-30 (*)    All other components within normal limits  TSH  CBG MONITORING, ED     ____________________________________________   EKG  I, Phineas Semen, attending physician, personally viewed and interpreted this EKG  EKG Time: 1813 Rate: 73 Rhythm: normal sinus rhythm Axis: normal Intervals: qtc 431 QRS: narrow ST changes: no st elevation Impression: normal ekg   ____________________________________________    RADIOLOGY  None  ____________________________________________   PROCEDURES  Procedures  ____________________________________________   INITIAL IMPRESSION / ASSESSMENT AND PLAN / ED COURSE  Pertinent labs & imaging results that were available during my care of the patient were reviewed by me and considered in my medical decision making (see chart for details).  Patient's blood work and urine without concerning findings here. I did add on a TSH which was within normal limits. Patient was given IV fluids and Compazine did feel improvement. Did have patient get up and walk around and she did not have any discomfort. This point will plan on discharging home to follow up with family care  physician.  ____________________________________________   FINAL CLINICAL IMPRESSION(S) / ED DIAGNOSES  Final diagnoses:  Syncope, unspecified syncope type     Note: This dictation was prepared with Dragon dictation. Any transcriptional errors that result from this process are unintentional     Phineas Semen, MD 05/26/16 2127

## 2016-06-17 ENCOUNTER — Encounter: Payer: Self-pay | Admitting: Emergency Medicine

## 2016-06-17 ENCOUNTER — Emergency Department
Admission: EM | Admit: 2016-06-17 | Discharge: 2016-06-17 | Disposition: A | Discharge status: Home / Self Care | Payer: Self-pay | Attending: Student in an Organized Health Care Education/Training Program | Admitting: Student in an Organized Health Care Education/Training Program

## 2016-06-17 ENCOUNTER — Emergency Department: Payer: Self-pay

## 2016-06-17 DIAGNOSIS — J45909 Unspecified asthma, uncomplicated: Secondary | ICD-10-CM | POA: Insufficient documentation

## 2016-06-17 DIAGNOSIS — Y999 Unspecified external cause status: Secondary | ICD-10-CM | POA: Insufficient documentation

## 2016-06-17 DIAGNOSIS — Y939 Activity, unspecified: Secondary | ICD-10-CM | POA: Insufficient documentation

## 2016-06-17 DIAGNOSIS — S63501A Unspecified sprain of right wrist, initial encounter: Secondary | ICD-10-CM | POA: Insufficient documentation

## 2016-06-17 DIAGNOSIS — Y929 Unspecified place or not applicable: Secondary | ICD-10-CM | POA: Insufficient documentation

## 2016-06-17 DIAGNOSIS — W010XXA Fall on same level from slipping, tripping and stumbling without subsequent striking against object, initial encounter: Secondary | ICD-10-CM | POA: Insufficient documentation

## 2016-06-17 DIAGNOSIS — F17210 Nicotine dependence, cigarettes, uncomplicated: Secondary | ICD-10-CM | POA: Insufficient documentation

## 2016-06-17 MED ORDER — OXYCODONE-ACETAMINOPHEN 5-325 MG PO TABS
1.0000 | ORAL_TABLET | Freq: Once | ORAL | Status: AC
  Administered 2016-06-17: 1 via ORAL
  Filled 2016-06-17: qty 1

## 2016-06-17 MED ORDER — IBUPROFEN 600 MG PO TABS
600.0000 mg | ORAL_TABLET | Freq: Once | ORAL | Status: AC
  Administered 2016-06-17: 600 mg via ORAL
  Filled 2016-06-17: qty 1

## 2016-06-17 MED ORDER — TRAMADOL HCL 50 MG PO TABS: 50.0000 mg | ORAL_TABLET | Freq: Two times a day (BID) | ORAL | 0 refills | Status: DC | PRN

## 2016-06-17 MED ORDER — NAPROXEN 500 MG PO TABS: 500.0000 mg | ORAL_TABLET | Freq: Two times a day (BID) | ORAL | Status: DC

## 2016-06-17 MED ORDER — IBUPROFEN 600 MG PO TABS: 600.0000 mg | ORAL_TABLET | Freq: Four times a day (QID) | ORAL | 0 refills | Status: DC | PRN
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
## 2016-06-17 NOTE — Discharge Instructions (Signed)
Wear splint for 3-5 days as needed. °

## 2017-12-14 IMAGING — DX DG WRIST COMPLETE 3+V*R*
4 series · 4 of 4 positions shown · non-contrast
Comparison: None.

CLINICAL DATA: Fell down stairs last night. Right wrist pain with
movement.

EXAM:
RIGHT WRIST - COMPLETE 3+ VIEW

[wrist ap (1 of 2)]
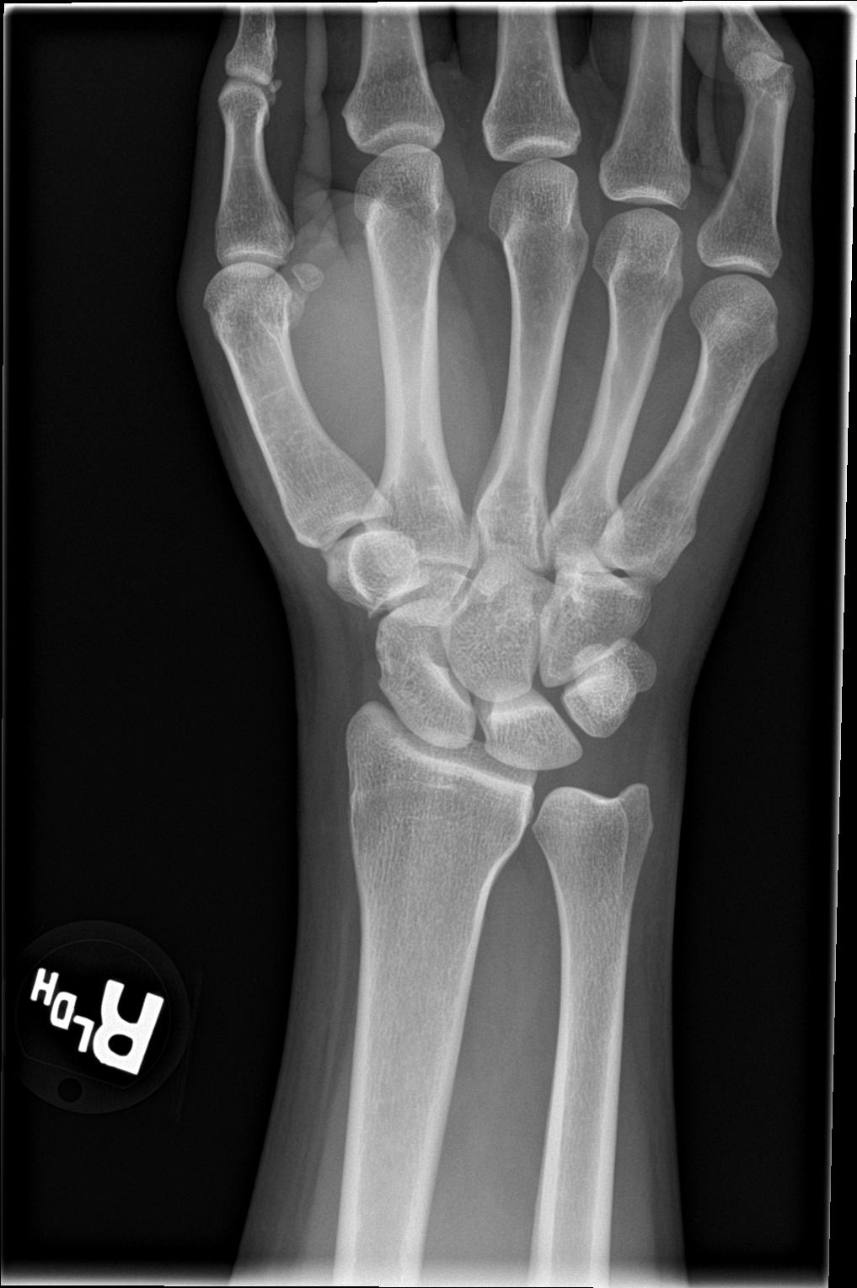

[wrist obl]
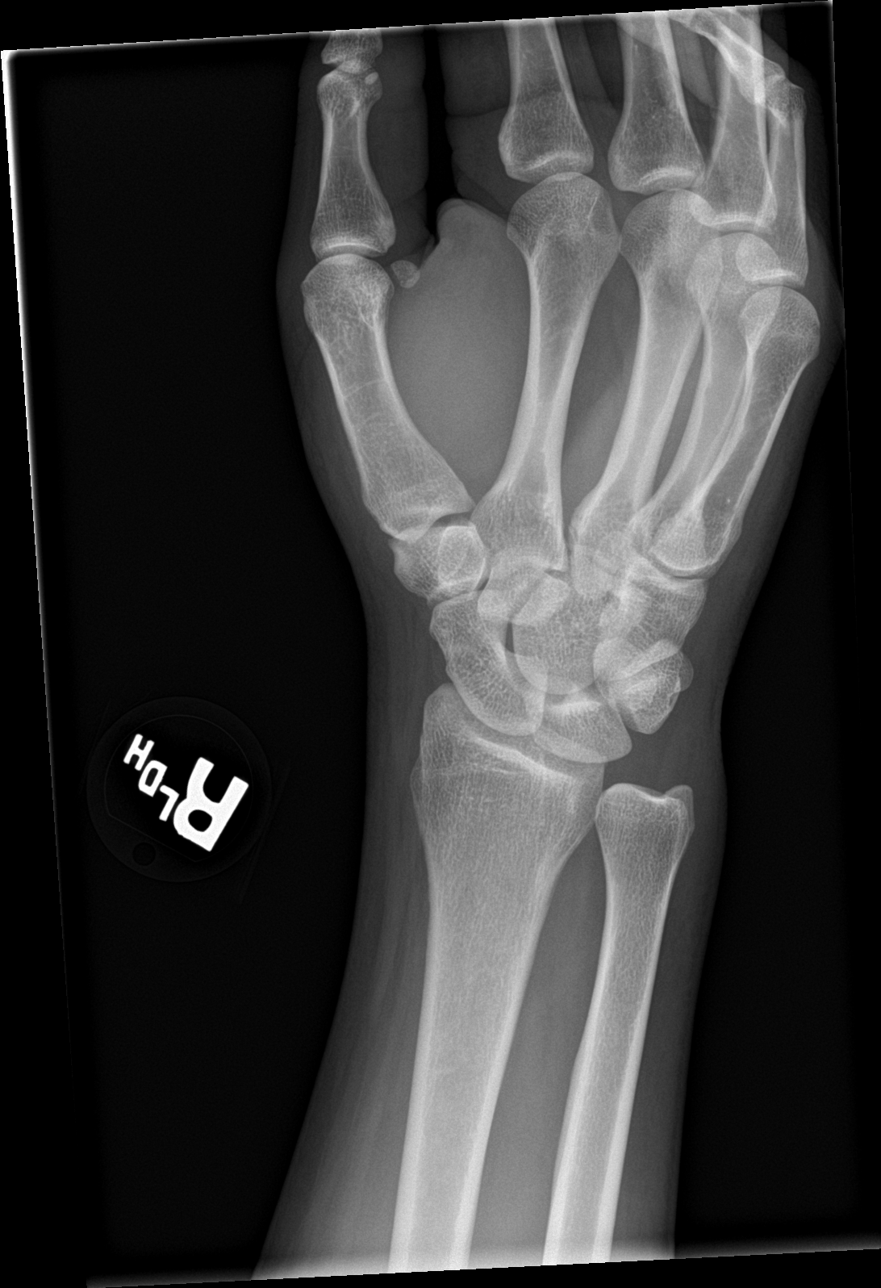

[wrist lat]
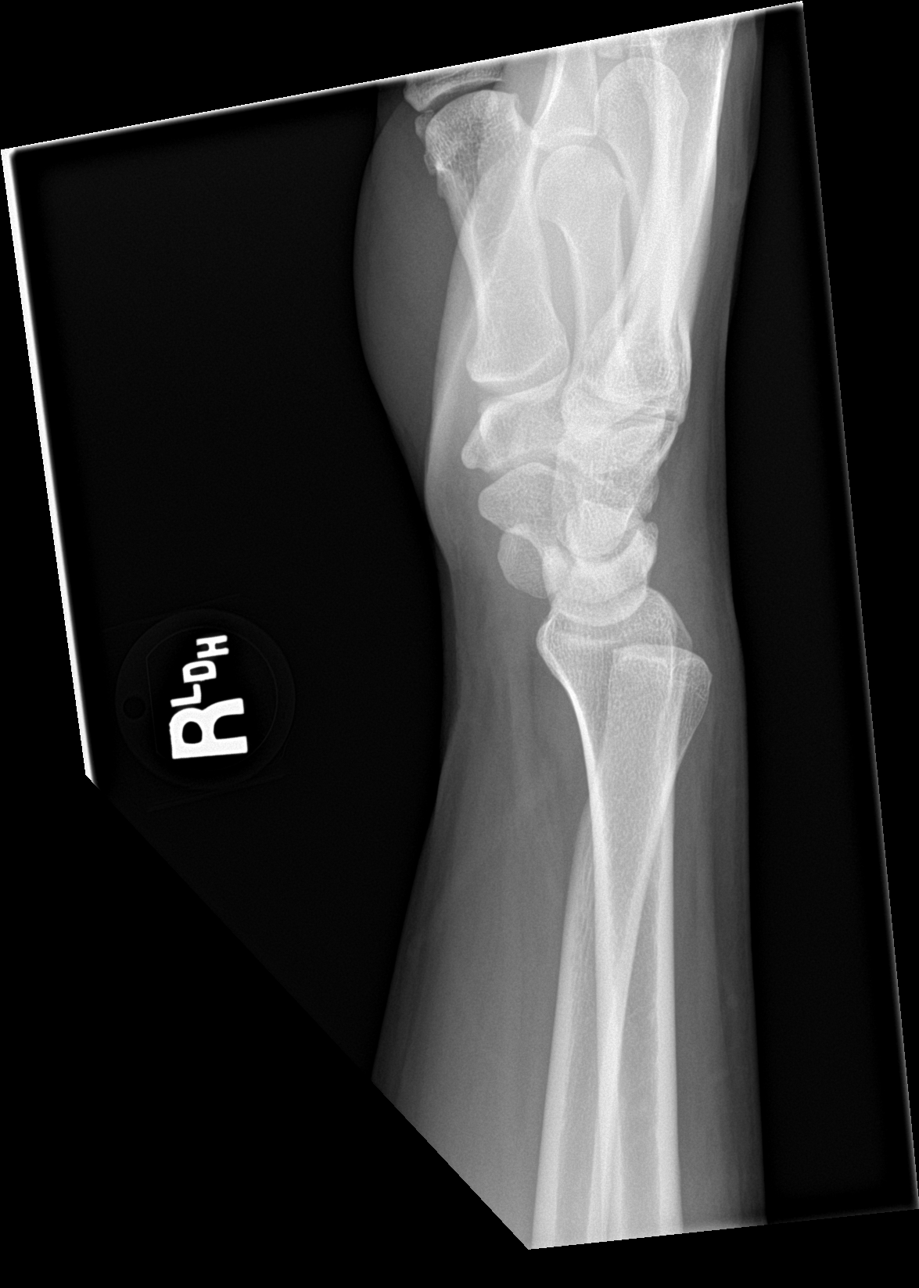

[wrist ap (2 of 2)]
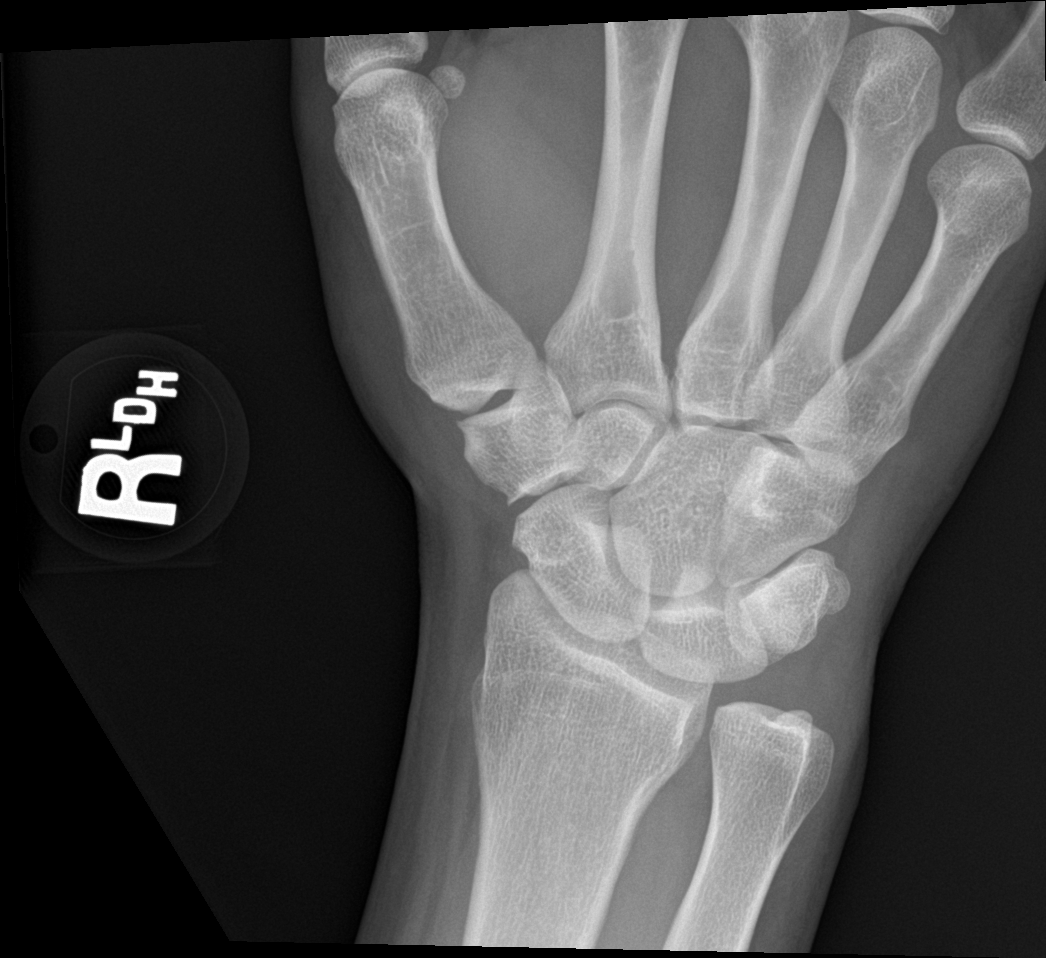

[4 of 4 positions shown; findings below may reference images not displayed]

FINDINGS: Negative for fracture or dislocation. The soft tissues are
unremarkable. Mild spurring and degenerative changes involving the
trapezium.
IMPRESSION: No acute bone abnormality to the right wrist.

## 2018-09-18 ENCOUNTER — Ambulatory Visit: Payer: Self-pay

## 2019-06-01 ENCOUNTER — Emergency Department
Admission: EM | Admit: 2019-06-01 | Discharge: 2019-06-01 | Disposition: A | Discharge status: Home / Self Care | Payer: Self-pay | Attending: Emergency Medicine | Admitting: Emergency Medicine

## 2019-06-01 ENCOUNTER — Other Ambulatory Visit: Payer: Self-pay

## 2019-06-01 DIAGNOSIS — Z5321 Procedure and treatment not carried out due to patient leaving prior to being seen by health care provider: Secondary | ICD-10-CM | POA: Insufficient documentation

## 2019-06-01 DIAGNOSIS — R519 Headache, unspecified: Secondary | ICD-10-CM | POA: Insufficient documentation

## 2019-06-01 NOTE — ED Triage Notes (Signed)
Called no answer

## 2019-06-01 NOTE — ED Notes (Signed)
Called   No answer in lobby  

## 2019-06-01 NOTE — ED Notes (Signed)
Called   No answer in looby
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
## 2019-06-01 NOTE — ED Triage Notes (Signed)
Called for room, no answer

## 2019-07-17 ENCOUNTER — Other Ambulatory Visit: Payer: Self-pay

## 2019-07-17 ENCOUNTER — Emergency Department
Admission: EM | Admit: 2019-07-17 | Discharge: 2019-07-17 | Disposition: A | Discharge status: Home / Self Care | Payer: Medicaid Other | Attending: Emergency Medicine | Admitting: Emergency Medicine

## 2019-07-17 ENCOUNTER — Emergency Department: Payer: Medicaid Other

## 2019-07-17 DIAGNOSIS — N83202 Unspecified ovarian cyst, left side: Secondary | ICD-10-CM | POA: Insufficient documentation

## 2019-07-17 DIAGNOSIS — N939 Abnormal uterine and vaginal bleeding, unspecified: Secondary | ICD-10-CM | POA: Insufficient documentation

## 2019-07-17 DIAGNOSIS — N83201 Unspecified ovarian cyst, right side: Secondary | ICD-10-CM

## 2019-07-17 DIAGNOSIS — Z79899 Other long term (current) drug therapy: Secondary | ICD-10-CM | POA: Insufficient documentation

## 2019-07-17 DIAGNOSIS — R102 Pelvic and perineal pain: Secondary | ICD-10-CM | POA: Insufficient documentation

## 2019-07-17 DIAGNOSIS — F17210 Nicotine dependence, cigarettes, uncomplicated: Secondary | ICD-10-CM | POA: Insufficient documentation

## 2019-07-17 LAB — BASIC METABOLIC PANEL
Anion gap: 7 (ref 5–15)
BUN: 11 mg/dL (ref 6–20)
CO2: 27 mmol/L (ref 22–32)
Calcium: 9.2 mg/dL (ref 8.9–10.3)
Chloride: 104 mmol/L (ref 98–111)
Creatinine, Ser: 0.83 mg/dL (ref 0.44–1.00)
GFR calc Af Amer: >60 mL/min (ref >60)
GFR calc non Af Amer: >60 mL/min (ref >60)
Glucose, Bld: 95 mg/dL (ref 70–99)
Potassium: 3.7 mmol/L (ref 3.5–5.1)
Sodium: 138 mmol/L (ref 135–145)

## 2019-07-17 LAB — CBC
HCT: 42.7 % (ref 36.0–46.0)
Hemoglobin: 14.1 g/dL (ref 12.0–15.0)
MCH: 28.8 pg (ref 26.0–34.0)
MCHC: 33 g/dL (ref 30.0–36.0)
MCV: 87.1 fL (ref 80.0–100.0)
Platelets: 576 10*3/uL — ABNORMAL HIGH (ref 150–400)
RBC: 4.9 MIL/uL (ref 3.87–5.11)
RDW: 15.1 % (ref 11.5–15.5)
WBC: 7 10*3/uL (ref 4.0–10.5)
nRBC: 0 % (ref 0.0–0.2)

## 2019-07-17 LAB — PREGNANCY, URINE: Preg Test, Ur: NEGATIVE
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
## 2019-07-17 NOTE — ED Notes (Signed)
Pt's urine previously sent to lab from triage. Order changed for lab to run POC preg test, Marylene Land in lab informed

## 2019-09-01 ENCOUNTER — Encounter: Payer: Self-pay | Admitting: Emergency Medicine

## 2019-09-01 ENCOUNTER — Emergency Department: Payer: Medicaid Other

## 2019-09-01 ENCOUNTER — Emergency Department
Admission: EM | Admit: 2019-09-01 | Discharge: 2019-09-01 | Disposition: A | Discharge status: Home / Self Care | Payer: Medicaid Other | Attending: Emergency Medicine | Admitting: Emergency Medicine

## 2019-09-01 ENCOUNTER — Other Ambulatory Visit: Payer: Self-pay

## 2019-09-01 DIAGNOSIS — F17210 Nicotine dependence, cigarettes, uncomplicated: Secondary | ICD-10-CM | POA: Insufficient documentation

## 2019-09-01 DIAGNOSIS — Y999 Unspecified external cause status: Secondary | ICD-10-CM | POA: Insufficient documentation

## 2019-09-01 DIAGNOSIS — S61451A Open bite of right hand, initial encounter: Secondary | ICD-10-CM | POA: Insufficient documentation

## 2019-09-01 DIAGNOSIS — Y929 Unspecified place or not applicable: Secondary | ICD-10-CM | POA: Insufficient documentation

## 2019-09-01 DIAGNOSIS — J45909 Unspecified asthma, uncomplicated: Secondary | ICD-10-CM | POA: Insufficient documentation

## 2019-09-01 DIAGNOSIS — Y939 Activity, unspecified: Secondary | ICD-10-CM | POA: Insufficient documentation

## 2019-09-01 DIAGNOSIS — S60221A Contusion of right hand, initial encounter: Secondary | ICD-10-CM

## 2019-09-01 MED ORDER — HYDROCODONE-ACETAMINOPHEN 5-325 MG PO TABS
1.0000 | ORAL_TABLET | Freq: Once | ORAL | Status: AC
  Administered 2019-09-01: 1 via ORAL
  Filled 2019-09-01: qty 1

## 2019-09-01 MED ORDER — AMOXICILLIN-POT CLAVULANATE 875-125 MG PO TABS
1.0000 | ORAL_TABLET | Freq: Once | ORAL | Status: AC
  Administered 2019-09-01: 1 via ORAL
  Filled 2019-09-01: qty 1

## 2019-09-01 MED ORDER — AMOXICILLIN-POT CLAVULANATE 875-125 MG PO TABS: 1.0000 | ORAL_TABLET | Freq: Two times a day (BID) | ORAL | 0 refills | Status: AC

## 2019-09-01 MED ORDER — IBUPROFEN 800 MG PO TABS: 800.0000 mg | ORAL_TABLET | Freq: Three times a day (TID) | ORAL | 0 refills | Status: AC | PRN

## 2019-09-01 MED ORDER — TRAMADOL HCL 50 MG PO TABS: 50.0000 mg | ORAL_TABLET | Freq: Two times a day (BID) | ORAL | 0 refills | Status: AC

## 2019-09-01 MED ORDER — TETANUS-DIPHTH-ACELL PERTUSSIS 5-2.5-18.5 LF-MCG/0.5 IM SUSP
0.5000 mL | Freq: Once | INTRAMUSCULAR | Status: DC
  Filled 2019-09-01: qty 0.5

## 2019-09-01 NOTE — ED Notes (Signed)
Right velcro wrist brace applied.
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
## 2019-09-01 NOTE — ED Notes (Signed)
Pt taken to xray immediately after rooming
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
## 2019-09-05 ENCOUNTER — Encounter: Payer: Medicaid Other | Admitting: Obstetrics & Gynecology

## 2019-12-17 ENCOUNTER — Ambulatory Visit: Payer: Medicaid Other

## 2020-01-22 ENCOUNTER — Ambulatory Visit: Payer: Medicaid Other

## 2020-04-01 ENCOUNTER — Telehealth: Payer: Self-pay

## 2020-04-01 ENCOUNTER — Other Ambulatory Visit: Payer: Self-pay

## 2020-04-01 NOTE — Telephone Encounter (Signed)
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 RN offered confirmatory testing. Patient declines history or exposure. Patient scheduled for today at 4pm.   Harvie Heck, RN

## 2020-04-01 NOTE — Telephone Encounter (Signed)
Call to patient, RN needs patient to be scheduled on provider schedule for confirmatory RPR. Patient rescheduled for Thursday at 1pm. LMOM to return call.   Harvie Heck, RN

## 2020-04-03 ENCOUNTER — Ambulatory Visit: Payer: Self-pay

## 2020-04-10 ENCOUNTER — Ambulatory Visit: Payer: Medicaid Other

## 2020-04-14 ENCOUNTER — Ambulatory Visit: Payer: Medicaid Other

## 2020-04-21 ENCOUNTER — Ambulatory Visit: Payer: Medicaid Other

## 2020-05-21 ENCOUNTER — Ambulatory Visit: Payer: Medicaid Other

## 2020-06-06 ENCOUNTER — Ambulatory Visit: Payer: Medicaid Other

## 2020-06-09 ENCOUNTER — Telehealth: Payer: Self-pay

## 2020-06-09 NOTE — Telephone Encounter (Signed)
Phone call to pt to inquire if we could reschedule missed appt from 06/06/20. Mailbox full unable to leave message. 

## 2020-06-09 NOTE — Telephone Encounter (Signed)
Phone call to pt to inquire if we could reschedule missed appt from 06/06/20. Mailbox full unable to leave message.

## 2020-06-09 NOTE — Telephone Encounter (Signed)
Pt called and scheduled appt for 06/10/20.

## 2020-06-09 NOTE — Telephone Encounter (Signed)
-----   Message from Kateri Plummer, RN sent at 06/06/2020  5:07 PM EDT ----- Regarding: RE: syphili I have not received anything on her at this time. ----- Message ----- From: Tracey Harries, RN Sent: 06/06/2020   2:16 PM EDT To: Kateri Plummer, RN Subject: syphili                                        Do you have anything from [facility] on this pt?  Shawna Orleans tried to get pt in March. Pt missed another appt this morning and note lines mentions treatment and syphilis.

## 2020-06-10 ENCOUNTER — Ambulatory Visit: Payer: Medicaid Other

## 2020-07-03 ENCOUNTER — Ambulatory Visit: Payer: Medicaid Other

## 2022-04-27 ENCOUNTER — Other Ambulatory Visit: Payer: Self-pay

## 2022-04-27 ENCOUNTER — Emergency Department
Admission: EM | Admit: 2022-04-27 | Discharge: 2022-04-28 | Discharge status: Against Medical Advice | Payer: Medicaid Other | Attending: Emergency Medicine | Admitting: Emergency Medicine

## 2022-04-27 DIAGNOSIS — R42 Dizziness and giddiness: Secondary | ICD-10-CM | POA: Insufficient documentation

## 2022-04-27 DIAGNOSIS — Z20822 Contact with and (suspected) exposure to covid-19: Secondary | ICD-10-CM | POA: Insufficient documentation

## 2022-04-27 DIAGNOSIS — R519 Headache, unspecified: Secondary | ICD-10-CM | POA: Insufficient documentation

## 2022-04-27 DIAGNOSIS — R1110 Vomiting, unspecified: Secondary | ICD-10-CM | POA: Insufficient documentation

## 2022-04-27 DIAGNOSIS — Z5321 Procedure and treatment not carried out due to patient leaving prior to being seen by health care provider: Secondary | ICD-10-CM | POA: Insufficient documentation

## 2022-04-27 DIAGNOSIS — R1030 Lower abdominal pain, unspecified: Secondary | ICD-10-CM | POA: Insufficient documentation

## 2022-04-27 LAB — COMPREHENSIVE METABOLIC PANEL
ALT: 15 U/L (ref 0–44)
AST: 22 U/L (ref 15–41)
Albumin: 4.5 g/dL (ref 3.5–5.0)
Alkaline Phosphatase: 68 U/L (ref 38–126)
Anion gap: 10 (ref 5–15)
BUN: 10 mg/dL (ref 6–20)
CO2: 27 mmol/L (ref 22–32)
Calcium: 9.2 mg/dL (ref 8.9–10.3)
Chloride: 101 mmol/L (ref 98–111)
Creatinine, Ser: 0.63 mg/dL (ref 0.44–1.00)
GFR, Estimated: >60 mL/min (ref >60)
Glucose, Bld: 93 mg/dL (ref 70–99)
Potassium: 3.7 mmol/L (ref 3.5–5.1)
Sodium: 138 mmol/L (ref 135–145)
Total Bilirubin: 0.8 mg/dL (ref 0.3–1.2)
Total Protein: 8.6 g/dL — ABNORMAL HIGH (ref 6.5–8.1)

## 2022-04-27 LAB — CBC
HCT: 44 % (ref 36.0–46.0)
Hemoglobin: 14.4 g/dL (ref 12.0–15.0)
MCH: 29.1 pg (ref 26.0–34.0)
MCHC: 32.7 g/dL (ref 30.0–36.0)
MCV: 88.9 fL (ref 80.0–100.0)
Platelets: 520 10*3/uL — ABNORMAL HIGH (ref 150–400)
RBC: 4.95 MIL/uL (ref 3.87–5.11)
RDW: 14.8 % (ref 11.5–15.5)
WBC: 8.3 10*3/uL (ref 4.0–10.5)
nRBC: 0 % (ref 0.0–0.2)

## 2022-04-27 LAB — RESP PANEL BY RT-PCR (RSV, FLU A&B, COVID)  RVPGX2
Influenza A by PCR: NEGATIVE
Influenza B by PCR: NEGATIVE
Resp Syncytial Virus by PCR: NEGATIVE
SARS Coronavirus 2 by RT PCR: NEGATIVE

## 2022-04-27 LAB — LIPASE, BLOOD: Lipase: 31 U/L (ref 11–51)

## 2022-04-27 MED ORDER — ONDANSETRON 4 MG PO TBDP
4.0000 mg | ORAL_TABLET | Freq: Once | ORAL | Status: AC | PRN
  Administered 2022-04-27: 4 mg via ORAL
  Filled 2022-04-27: qty 1

## 2022-04-27 NOTE — ED Triage Notes (Signed)
Abd pain with n/v. H/a and dizziness reported as well. Staes abd pain is lower. All symptoms with acute onset just prior to arrival. Pt alert and oriented following commands. Ambulatory to triage. Denies hx of h/a or migraine. Pt denies fever at home. Pt vomiting in triage. Denies chest pain or SOB. Denies diarrhea.

## 2022-04-28 NOTE — ED Notes (Signed)
No answer when called several times from lobby 

## 2023-03-07 ENCOUNTER — Ambulatory Visit: Payer: 59

## 2024-03-13 ENCOUNTER — Encounter: Admitting: Registered Nurse

## 2024-04-23 ENCOUNTER — Ambulatory Visit: Admitting: Family Medicine
